# Patient Record
Sex: Male | Born: 1970
Health system: Southern US, Community
[De-identification: ages and names within clinical notes are randomized; demographics above are authoritative.]

## PROBLEM LIST (undated history)

## (undated) DIAGNOSIS — E663 Overweight: Secondary | ICD-10-CM

## (undated) DIAGNOSIS — I10 Essential (primary) hypertension: Secondary | ICD-10-CM

## (undated) DIAGNOSIS — F419 Anxiety disorder, unspecified: Secondary | ICD-10-CM

## (undated) DIAGNOSIS — IMO0001 Reserved for inherently not codable concepts without codable children: Secondary | ICD-10-CM

## (undated) DIAGNOSIS — T7840XA Allergy, unspecified, initial encounter: Secondary | ICD-10-CM

## (undated) HISTORY — DX: Overweight: E66.3

## (undated) HISTORY — DX: Anxiety disorder, unspecified: F41.9

## (undated) HISTORY — DX: Essential (primary) hypertension: I10

## (undated) HISTORY — PX: NO PAST SURGERIES: SHX2092

## (undated) HISTORY — DX: Reserved for inherently not codable concepts without codable children: IMO0001

## (undated) HISTORY — DX: Allergy, unspecified, initial encounter: T78.40XA

---

## 2003-08-25 ENCOUNTER — Encounter: Payer: Self-pay | Admitting: Chiropractic Medicine

## 2003-08-25 ENCOUNTER — Encounter: Admission: RE | Admit: 2003-08-25 | Discharge: 2003-08-25 | Payer: Self-pay | Admitting: Chiropractic Medicine

## 2005-11-09 ENCOUNTER — Ambulatory Visit: Payer: Self-pay | Admitting: Internal Medicine

## 2006-02-06 ENCOUNTER — Ambulatory Visit: Payer: Self-pay | Admitting: Internal Medicine

## 2006-10-15 ENCOUNTER — Ambulatory Visit: Payer: Self-pay | Admitting: Internal Medicine

## 2008-03-22 ENCOUNTER — Ambulatory Visit: Payer: Self-pay | Admitting: Internal Medicine

## 2008-09-02 ENCOUNTER — Ambulatory Visit: Payer: Self-pay | Admitting: Family Medicine

## 2008-11-22 ENCOUNTER — Ambulatory Visit: Payer: Self-pay | Admitting: Family Medicine

## 2008-11-23 ENCOUNTER — Encounter (INDEPENDENT_AMBULATORY_CARE_PROVIDER_SITE_OTHER): Payer: Self-pay | Admitting: *Deleted

## 2010-03-10 ENCOUNTER — Ambulatory Visit: Payer: Self-pay | Admitting: Family Medicine

## 2010-03-10 DIAGNOSIS — R319 Hematuria, unspecified: Secondary | ICD-10-CM | POA: Insufficient documentation

## 2010-03-10 LAB — CONVERTED CEMR LAB
Bilirubin Urine: NEGATIVE
Glucose, Urine, Semiquant: NEGATIVE
Ketones, urine, test strip: NEGATIVE
Nitrite: NEGATIVE
Protein, U semiquant: NEGATIVE
Specific Gravity, Urine: 1.01
Urobilinogen, UA: 1
WBC Urine, dipstick: NEGATIVE
pH: 6.5

## 2010-03-11 ENCOUNTER — Encounter: Payer: Self-pay | Admitting: Family Medicine

## 2010-03-13 ENCOUNTER — Telehealth (INDEPENDENT_AMBULATORY_CARE_PROVIDER_SITE_OTHER): Payer: Self-pay | Admitting: *Deleted

## 2010-03-13 LAB — CONVERTED CEMR LAB
Basophils Absolute: 0 10*3/uL (ref 0.0–0.1)
Basophils Relative: 0 % (ref 0–1)
CRP: 1.5 mg/dL — ABNORMAL HIGH (ref <0.6)
Creatinine, Ser: 0.98 mg/dL (ref 0.40–1.50)
HCT: 43.7 % (ref 39.0–52.0)
Lymphs Abs: 1.3 10*3/uL (ref 0.7–4.0)
MCHC: 33 g/dL (ref 30.0–36.0)
MCV: 87.6 fL (ref 78.0–100.0)
Neutrophils Relative %: 58 % (ref 43–77)
Platelets: 322 10*3/uL (ref 150–400)
Potassium: 5.1 meq/L (ref 3.5–5.3)
Sed Rate: 7 mm/hr (ref 0–16)
Sodium: 143 meq/L (ref 135–145)

## 2010-05-31 ENCOUNTER — Ambulatory Visit: Payer: Self-pay | Admitting: Internal Medicine

## 2010-12-26 NOTE — Assessment & Plan Note (Signed)
Summary: sore throat/fever//kn   Vital Signs:  Patient profile:   40 year old male Weight:      194.13 pounds Temp:     98.5 degrees F oral Pulse rate:   113 / minute Pulse rhythm:   regular BP sitting:   126 / 76  (left arm) Cuff size:   large  Vitals Entered By: Army Fossa CMA (May 31, 2010 9:11 AM) CC: C/o sore throat, fever x 36 hrs Comments - states last night temp was 101.0 this am, 99.0     History of Present Illness: 3 days' history of fever up to 103. Fever decreased with ibuprofen He also developed sore throat and some fatigue. No other family members affected with fever  ROS No runny nose No cough Moderate myalgias, generalized No nausea vomiting or diarrhea.  Allergies (verified): No Known Drug Allergies  Past History:  Past Medical History: Reviewed history from 03/22/2008 and no changes required. Unremarkable  Past Surgical History: Reviewed history from 11/22/2008 and no changes required. None  Social History: Married to [employer] with 2 children. Has a masters degree.  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   Head:  face symmetric Ears:  R ear normal and L ear normal.   Nose:  slightly congested Mouth:  no redness or discharge. Tonsils normal size Lungs:  normal respiratory effort, no intercostal retractions, no accessory muscle use, and normal breath sounds.   Heart:  normal rate, regular rhythm, no murmur, and no gallop.     Impression & Recommendations:  Problem # 1:  VIRAL INFECTION (ICD-079.99) strep A- negative Likely viral syndrome Conservative treatment, see instructions  Complete Medication List: 1)  Zyrtec Allergy 10 Mg Tabs (Cetirizine hcl) .... Take one tablet daily  Other Orders: Rapid Strep (09811)  Patient  Instructions: 1)  Get plenty of rest, drink lots of clear liquids, and use Tylenol or Ibuprofen for fever and comfort. 2)  robitussin DM as needed for cough 3)  sudafed as needed for congestion 4)  call if no better in few days and any time if high fever or worsen symptoms  5)     Laboratory Results    Other Tests  Rapid Strep: negative Comments: Army Fossa CMA  May 31, 2010 9:15 AM

## 2010-12-26 NOTE — Progress Notes (Signed)
Summary: labs  Phone Note Outgoing Call   Call placed by: Doristine Devoid,  March 13, 2010 3:27 PM Call placed to: Patient Summary of Call: CRP is mildly elevated but remainder of labs normal.  CRP can be elevated for many reasons- will need to f/u w/ pt and see how he's feeling and if fevers are persisting  Follow-up for Phone Call        spoke w/ patient aware of labs says he is doing fine and will contact office if symptoms are continuing b/c if so he will need additional w/u ........Marland KitchenDoristine Devoid  March 13, 2010 3:28 PM

## 2010-12-26 NOTE — Assessment & Plan Note (Signed)
Summary: fever at night/kdc   Vital Signs:  Patient profile:   40 year old male Weight:      198 pounds BMI:     28.92 Temp:     99.0 degrees F oral BP sitting:   120 / 60  (left arm)  Vitals Entered By: Doristine Devoid (March 10, 2010 3:09 PM) CC: fever at night up to 100 x3 days fatigue and bodyache- no other sx    History of Present Illness: 40 yo man here today for night fever.  sxs started 3 days ago.  notes fatigue and muscle aches.  temp up to 100 via ear thermometer.  sxs seem to worsen as the day goes on.  wakes up feeling rested and well but then fatigues as the day progresses.  aches most noticeable in arms and neck.  no N/VD.  no nasal congestion, sore throat.  'the feeling that you know you're getting sick but i'm not sick'.  Current Medications (verified): 1)  Astepro 137 Mcg/spray  Soln (Azelastine Hcl) .... 2 Puffs Two Times A Day 2)  Zyrtec Allergy 10 Mg Tabs (Cetirizine Hcl) .... Take One Tablet Daily  Allergies (verified): No Known Drug Allergies  Past History:  Past Medical History: Last updated: 03/22/2008 Unremarkable  Family History: Last updated: 03/22/2008 CAD - no HTN - no stroke - no DM - no colon ca - no prostate ca - no  Review of Systems      See HPI  Physical Exam  General:  Well-developed,well-nourished,in no acute distress; alert,appropriate and cooperative throughout examination Head:  Normocephalic and atraumatic without obvious abnormalities. No apparent alopecia or balding. Eyes:  No corneal or conjunctival inflammation noted. EOMI. Perrla. Ears:  External ear exam shows no significant lesions or deformities.  Otoscopic examination reveals clear canals, tympanic membranes are intact bilaterally without bulging, retraction, inflammation or discharge. Hearing is grossly normal bilaterally. Nose:  External nasal examination shows no deformity or inflammation. Mucosal membranes are mildly swollen.  Mouth:  Oral mucosa and oropharynx  without lesions or exudates.  Teeth in good repair. Neck:  No deformities, masses, or tenderness noted. Lungs:  Normal respiratory effort, chest expands symmetrically. Lungs are clear to auscultation, no crackles or wheezes. Heart:  Normal rate and regular rhythm. S1 and S2 normal without gallop, murmur, click, rub or other extra sounds. Abdomen:  Bowel sounds positive,abdomen soft and non-tender without masses, organomegaly   Impression & Recommendations:  Problem # 1:  FEVER (ICD-780.60) Assessment New pt's low grade temp likely viral but given the absence of other sxs will attempt to r/o underlying infxn, inflammation, or malignancy.  check CXR, UA, and labs.  reviewed supportive care and red flags that should prompt return.  Pt expresses understanding and is in agreement w/ this plan. Orders: Venipuncture (16109) UA Dipstick w/o Micro (manual) (60454) Specimen Handling (99000) T-Culture, Urine ([id number]) T-2 View CXR (71020TC)  Problem # 2:  FATIGUE (ICD-780.79) Assessment: New see above. Orders: UA Dipstick w/o Micro (manual) (29562) Specimen Handling (99000) T-Culture, Urine ([id number])  Complete Medication List: 1)  Astepro 137 Mcg/spray Soln (Azelastine hcl) .... 2 puffs two times a day 2)  Zyrtec Allergy 10 Mg Tabs (Cetirizine hcl) .... Take one tablet daily  Patient Instructions: 1)  Please go to [address] for your chest xray 2)  We'll notify you of your lab work 3)  Take tylenol alternating w/ ibuprofen for body aches and fever 4)  Call with any questions or concerns 5)  Hang  in there!  Laboratory Results   Urine Tests   Date/Time Reported: March 10, 2010 3:50 PM   Routine Urinalysis   Color: yellow Appearance: Clear Glucose: negative   (Normal Range: Negative) Bilirubin: negative   (Normal Range: Negative) Ketone: negative   (Normal Range: Negative) Spec. Gravity: 1.010   (Normal Range: 1.003-1.035) Blood: small   (Normal Range: Negative) pH:  6.5   (Normal Range: 5.0-8.0) Protein: negative   (Normal Range: Negative) Urobilinogen: 1.0   (Normal Range: 0-1) Nitrite: negative   (Normal Range: Negative) Leukocyte Esterace: negative   (Normal Range: Negative)    Comments: Floydene Flock  March 10, 2010 3:50 PM cx sent

## 2011-01-26 ENCOUNTER — Encounter: Payer: Self-pay | Admitting: Family Medicine

## 2011-01-26 ENCOUNTER — Ambulatory Visit (INDEPENDENT_AMBULATORY_CARE_PROVIDER_SITE_OTHER): Payer: Managed Care, Other (non HMO) | Admitting: Family Medicine

## 2011-01-26 DIAGNOSIS — M25579 Pain in unspecified ankle and joints of unspecified foot: Secondary | ICD-10-CM

## 2011-01-26 DIAGNOSIS — J309 Allergic rhinitis, unspecified: Secondary | ICD-10-CM | POA: Insufficient documentation

## 2011-02-01 NOTE — Assessment & Plan Note (Signed)
Summary: RT ANKLE PAIN (TWISTED?)/NP/LP  # [id number]   Vital Signs:  Patient profile:   40 year old male Height:      69.5 inches Weight:      191 pounds BMI:     27.90 BP sitting:   124 / 85  Vitals Entered By: Lillia Pauls CMA (January 26, 2011 11:18 AM)  Primary Care Provider:  Nolon Rod. Paz MD   History of Present Illness: 40 yo M here for R ankle pain  Patient reports about 6 weeks ago when playing soccer he rolled his ankle - probably inversion but he's not completely sure At that time had soreness in right foot medially but no swelling or bruising Was able to continue playing. Pain improved over past several weeks those some soreness with stairs Then today when walking down stairs he missed a step and inverted ankle again Severe pain initially with limping, pain located medially again but no swelling or bruising. Laid down, took advil, elevated and pain has improved quite a bit since then.   Current Problems (verified): 1)  Allergic Rhinitis  (ICD-477.9) 2)  Hematuria Unspecified  (ICD-599.70)  Current Medications (verified): 1)  Zyrtec Allergy 10 Mg Tabs (Cetirizine Hcl) .... Take One Tablet Daily  Allergies (verified): No Known Drug Allergies  Family History: Reviewed history from 03/22/2008 and no changes required. CAD - no HTN - no stroke - no DM - no colon ca - no prostate ca - no  Social History: Reviewed history from 05/31/2010 and no changes required. Married to [employer] with 2 children. Has a masters degree.  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 Well preserved long and transverse arches TTP over post tib tendon medially posterior to medial malleolus  and just distal to this in course.  No bony TTP lateral/medial malleoli, navicular, base 5th or elsewhere about foot or ankle. Pain reproduced with plantarflexion and internal rotation though strength grossly 5/5 with both and can do heel raise one footed (pain with this). Negative ant drawer and talar tilt Negative tib-fib compression. Negative thompsons. NVI distally.   Impression & Recommendations:  Problem # 1:  ANKLE PAIN, RIGHT (ICD-719.47) Assessment New 2/2 posterior tibialis tendon strain.  Much improved from this morning but still some pain.  Advil as needed, icing.  Elevation and ace wrap if starts to swell.  Full motion and strength so highly unlikely he has a tear within tendon.  Given home exercises with theraband to do regularly to strengthen this over the next 4-6 weeks.  Discussed possibility of ankle ASO to help prevent inversion injury again - will consider this with sports and in short term. See instructions for further.  Complete Medication List: 1)  Zyrtec Allergy 10 Mg Tabs (Cetirizine hcl) .... Take one tablet daily  Patient Instructions: 1)  You have strained your posterior tibialis tendon. 2)  Ice the area 15 minutes at a time 3-4 times a day. 3)  Ok to elevate and use ace wrap for compression if swelling becomes an issue. 4)  Do home exercises as directed to strengthen this muscle/tendon back up - 3 sets of 10 with theraband pushing down and pushing in with foot. 5)  Advil 2-3 tabs three times a day with food as  needed for pain and inflammation. 6)  A laceup ankle brace is an option to prevent further issues at ankle while you are strengthening this. 7)  Follow up with me as needed.   Orders Added: 1)  New Patient Level III [16109]

## 2011-08-20 ENCOUNTER — Encounter: Payer: Self-pay | Admitting: Internal Medicine

## 2011-08-20 ENCOUNTER — Ambulatory Visit (INDEPENDENT_AMBULATORY_CARE_PROVIDER_SITE_OTHER): Payer: Managed Care, Other (non HMO) | Admitting: Internal Medicine

## 2011-08-20 VITALS — BP 128/78 | HR 71 | Temp 98.4°F | Resp 14 | Wt 201.0 lb

## 2011-08-20 DIAGNOSIS — J029 Acute pharyngitis, unspecified: Secondary | ICD-10-CM

## 2011-08-20 LAB — POCT RAPID STREP A (OFFICE): Rapid Strep A Screen: NEGATIVE

## 2011-08-20 NOTE — Assessment & Plan Note (Signed)
Strep a neg, likely viral, see instructions

## 2011-08-20 NOTE — Patient Instructions (Signed)
Rest, fluids , tylenol Call if no completely well  in few days

## 2011-08-20 NOTE — Progress Notes (Signed)
  Subjective:    Patient ID: Jorge Schwartz, male    DOB: 1971/11/01, 40 y.o.   MRN: [id number]  HPI As above Sx are subsiding, used some chloraseptic OTC  Family History: CAD - no HTN - no stroke - no DM - no colon ca - no prostate ca - no  Social History:  Married to [employer] with 2 children. Has a masters degree.  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 No distress.  HENT:  Head: Normocephalic and atraumatic.  Right Ear: External ear normal.  Left Ear: External ear normal.  Mouth/Throat: No oropharyngeal exudate.       Uvula midline, tonsils normal size   Cardiovascular: Normal rate, regular rhythm and normal heart sounds.   No murmur heard. Pulmonary/Chest: Effort normal and breath sounds normal. No respiratory distress. He has no wheezes. He has no rales.  Lymphadenopathy:    He has no cervical adenopathy.  Skin: He is not diaphoretic.          Assessment & Plan:

## 2012-03-31 ENCOUNTER — Ambulatory Visit (INDEPENDENT_AMBULATORY_CARE_PROVIDER_SITE_OTHER): Payer: Managed Care, Other (non HMO) | Admitting: Family Medicine

## 2012-03-31 ENCOUNTER — Encounter: Payer: Self-pay | Admitting: Family Medicine

## 2012-03-31 VITALS — BP 146/85 | HR 58 | Temp 97.8°F | Ht 70.0 in | Wt 182.0 lb

## 2012-03-31 DIAGNOSIS — M549 Dorsalgia, unspecified: Secondary | ICD-10-CM

## 2012-03-31 DIAGNOSIS — S29012A Strain of muscle and tendon of back wall of thorax, initial encounter: Secondary | ICD-10-CM

## 2012-03-31 DIAGNOSIS — M546 Pain in thoracic spine: Secondary | ICD-10-CM

## 2012-03-31 DIAGNOSIS — S239XXA Sprain of unspecified parts of thorax, initial encounter: Secondary | ICD-10-CM

## 2012-03-31 MED ORDER — TRAMADOL HCL 50 MG PO TABS: 50.0000 mg | ORAL_TABLET | Freq: Three times a day (TID) | ORAL | Status: DC | PRN

## 2012-03-31 NOTE — Patient Instructions (Signed)
You have strained (overused) scapular stabilizer muscles in your upper back (trapezius, rhomboids) leading to severe spasms. Strengthening and stretches are important for long term relief of this. Don't do heavy weight exercises that will use this area (push ups, bench press, flys). Avoid painful activities as much as possible. Start rows, lawnmower, robbery exercises - start with no weight, work way up to 15 pounds over next 2 weeks). - 3 sets of 10 once a day of each. Aleve 2 tabs twice a day with food for pain and inflammation x 1 week then as needed. Tramadol as needed for severe pain but no driving on this. Consider physical therapy, muscle relaxants if not improving.

## 2012-03-31 NOTE — Assessment & Plan Note (Signed)
2/2 trapezius and/or rhomboid spasms.  Start HEP - declined formal PT at this time.  Start aleve 2 tabs twice a day with food.  Tramadol as needed for severe pain.  Declined muscle relaxant.  See instructions for further.

## 2012-03-31 NOTE — Progress Notes (Signed)
  Subjective:    Patient ID: Jorge Schwartz, male    DOB: 1970-12-20, 41 y.o.   MRN: [id number]  PCP: Dr. Drue Novel  HPI 41 yo M here for right upper back pain.  Patient denies known injury. He works out regularly - did so 1 week ago with weight lifting with mild soreness between spine and shoulder blade. Also has done some painting recently at home. Is right handed. Pain has intensified over past week, peaking Saturday and Sunday. Worse with certain movements - like scratching back with overhead motion. Tried advil, icy hot. No numbness, tingling, radiation. No prior upper back problems.  Past Medical History  Diagnosis Date  . No active medical problems     Current Outpatient Prescriptions on File Prior to Visit  Medication Sig Dispense Refill  . cetirizine (ZYRTEC) 10 MG tablet Take 10 mg by mouth daily.          Past Surgical History  Procedure Date  . No past surgeries     No Known Allergies  History   Social History  . Marital Status: Married    Spouse Name: N/A    Number of Children: N/A  . Years of Education: N/A   Occupational History  . Not on file.   Social History Main Topics  . Smoking status: Never Smoker   . Smokeless tobacco: Not on file  . Alcohol Use: Not on file  . Drug Use: Not on file  . Sexually Active: Not on file   Other Topics Concern  . Not on file   Social History Narrative  . No narrative on file    Family History  Problem Relation Age of Onset  . Sudden death Neg Hx     BP 146/85  Pulse 58  Temp(Src) 97.8 F (36.6 C) (Oral)  Ht 5\' 10"  (1.778 m)  Wt 182 lb (82.555 kg)  BMI 26.11 kg/m2  Review of Systems See HPI above.    Objective:   Physical Exam Gen: NAD  Neck/Upper back: No gross deformity, swelling, bruising.  Spasms R > L paraspinal region thoracic spine medial to scapula. TTP mildly on right side in this location.  No midline/bony TTP. FROM neck without pain currently (has had pain with neck motions in the  area noted above). BUE strength 5/5.   Sensation intact to light touch.   NV intact distal BUEs.     Assessment & Plan:  1. Upper back pain - 2/2 trapezius and/or rhomboid spasms.  Start HEP - declined formal PT at this time.  Start aleve 2 tabs twice a day with food.  Tramadol as needed for severe pain.  Declined muscle relaxant.  See instructions for further.

## 2012-04-01 MED ORDER — TRAMADOL HCL 50 MG PO TABS: 50.0000 mg | ORAL_TABLET | Freq: Three times a day (TID) | ORAL | Status: DC | PRN

## 2012-04-01 NOTE — Progress Notes (Signed)
Addended by: Lenda Kelp on: 04/01/2012 09:30 AM   Modules accepted: Orders

## 2012-10-10 ENCOUNTER — Ambulatory Visit (INDEPENDENT_AMBULATORY_CARE_PROVIDER_SITE_OTHER): Payer: Managed Care, Other (non HMO) | Admitting: Family Medicine

## 2012-10-10 VITALS — BP 110/82 | HR 77 | Temp 98.5°F | Wt 200.0 lb

## 2012-10-10 DIAGNOSIS — R0982 Postnasal drip: Secondary | ICD-10-CM

## 2012-10-10 DIAGNOSIS — Z23 Encounter for immunization: Secondary | ICD-10-CM

## 2012-10-10 NOTE — Progress Notes (Signed)
  Subjective:    Patient ID: Jorge Schwartz, male    DOB: 1970/12/13, 41 y.o.   MRN: [id number]  HPI Cough- 3 weeks ago had 'terrible terrible fever' x3 days.  Saw MD, took abx, but developed cough 'it just won't go away'.  Was given Tessalon w/out relief.  Took Mucinex DM.  Cough is constant, not productive.  No nasal congestion, sore throat, ear pain, PND, fevers.  Cough kept him awake last night.  No known sick contacts.   Review of Systems For ROS see HPI     Objective:   Physical Exam  Vitals reviewed. Constitutional: He appears well-developed and well-nourished. No distress.  HENT:  Head: Normocephalic and atraumatic.       No TTP over sinuses + turbinate edema + PND TMs normal bilaterally  Eyes: Conjunctivae normal and EOM are normal. Pupils are equal, round, and reactive to light.  Neck: Normal range of motion. Neck supple.  Cardiovascular: Normal rate, regular rhythm and normal heart sounds.   Pulmonary/Chest: Effort normal and breath sounds normal. No respiratory distress. He has no wheezes.       Dry cough  Lymphadenopathy:    He has no cervical adenopathy.  Skin: Skin is warm and dry.          Assessment & Plan:

## 2012-10-10 NOTE — Assessment & Plan Note (Signed)
New.  Likely the cause of ongoing cough.  Increase Zyrtec use to daily.  Add nasal steroid- sample of nasonex given.  Reviewed supportive care and red flags that should prompt return.  Pt expressed understanding and is in agreement w/ plan.

## 2012-10-10 NOTE — Patient Instructions (Addendum)
The cough is due to post nasal drip Start the Zyrtec daily until you feel better Add the nasal spray- 2 sprays each nostril to decrease post nasal drip Drink plenty of fluids Continue the Mucinex to thin your congestion REST! Hang in there!!

## 2013-01-22 ENCOUNTER — Ambulatory Visit (INDEPENDENT_AMBULATORY_CARE_PROVIDER_SITE_OTHER): Payer: BC Managed Care – PPO | Admitting: Internal Medicine

## 2013-01-22 ENCOUNTER — Telehealth: Payer: Self-pay | Admitting: *Deleted

## 2013-01-22 ENCOUNTER — Encounter: Payer: Self-pay | Admitting: Internal Medicine

## 2013-01-22 VITALS — BP 112/82 | HR 74 | Temp 98.0°F | Wt 206.0 lb

## 2013-01-22 DIAGNOSIS — R0602 Shortness of breath: Secondary | ICD-10-CM

## 2013-01-22 LAB — BASIC METABOLIC PANEL
Chloride: 107 mEq/L (ref 96–112)
Potassium: 4.5 mEq/L (ref 3.5–5.1)
Sodium: 140 mEq/L (ref 135–145)

## 2013-01-22 LAB — D-DIMER, QUANTITATIVE: D-Dimer, Quant: 0.27 ug/mL-FEU (ref 0.00–0.48)

## 2013-01-22 NOTE — Progress Notes (Signed)
  Subjective:    Patient ID: Jorge Schwartz, male    DOB: [birth year], 42 y.o.   MRN: [id number]  HPI Acute visit, presents with the following history: Last week, he took an airplane going to [facility], upon landing he felt sweaty, had chills, nasal congestion, had mild shortness of breath and felt claustrophobic. Symptoms lasted about 15 minutes. That night  he felt better, went to exercise. Later on he developed fever, chills and felt unwell. Had similar symptoms on his flight back but much milder.  Past Medical History  Diagnosis Date  . No active medical problems    Past Surgical History  Procedure Laterality Date  . No past surgeries     History   Social History  . Marital Status: Married    Spouse Name: N/A    Number of Children: 2  . Years of Education: N/A   Occupational History  . Not on file.   Social History Main Topics  . Smoking status: Never Smoker   . Smokeless tobacco: Never Used  . Alcohol Use: Yes     Comment: socially   . Drug Use: Not on file  . Sexually Active: Not on file   Other Topics Concern  . Not on file   Social History Narrative    Married to [employer] with 2 children. Has a masters degree.  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 He had mild sore throat but not cough, no postnasal dripping. Had some diarrhea but denies abdominal pain, nausea, vomiting. He is a frequent flyer, hardly ever gets nervous during flying.    Objective:   Physical Exam  General -- alert, well-developed, healthy appearing, VSS    Lungs -- normal respiratory effort, no intercostal retractions, no accessory muscle use, and normal breath sounds.   Heart-- normal rate, regular rhythm, no murmur, and no gallop.   Abdomen--soft, non-tender, no distention, no masses, no HSM, no guarding, and no rigidity.   Extremities-- no pretibial edema  bilaterally; calves symmetric  Neurologic-- alert & oriented X3 and strength normal in all extremities. Psych-- Cognition and judgment appear intact. Alert and cooperative with normal attention span and concentration.  not anxious appearing and not depressed appearing.       Assessment & Plan:

## 2013-01-22 NOTE — Assessment & Plan Note (Signed)
Shortness or breath Patient is a frequent  airplane flyer, last week developed sweats, chills and some shortness or breath during a airplane flight. Shortly after he had  fever. Most likely he had a viral syndrome however I think is important to rule out a clot. Symptoms could also be related to anxiety although he is usually very relaxed during his trips Plan:  D-dimer, BMP. If negative, then he most likely had a viral syndrome and will only need reassurance, rest and Tylenol. Will call if symptoms continue happening.

## 2013-01-22 NOTE — Patient Instructions (Signed)
Rest, fluids, Tylenol. Call if the  episodes continue happening.

## 2013-01-22 NOTE — Telephone Encounter (Signed)
D dimer normal- should proceed w/ plan as discussed at OV

## 2013-01-22 NOTE — Telephone Encounter (Signed)
Discuss with patient  

## 2013-01-22 NOTE — Telephone Encounter (Signed)
Stat labs report  D-dimer 0.27.Please advise

## 2013-02-13 ENCOUNTER — Telehealth: Payer: Self-pay | Admitting: Internal Medicine

## 2013-02-13 MED ORDER — ALPRAZOLAM ER 0.5 MG PO TB24: ORAL_TABLET | ORAL | Status: DC

## 2013-02-13 NOTE — Telephone Encounter (Signed)
Discussed with pt, sent rx to pharmacy.  

## 2013-02-13 NOTE — Telephone Encounter (Signed)
Spoke to pt, he states Dr. Drue Novel recently saw him for a panic attack on a plane. Pt states that he has been stressing over this & has seen a therapist. Pt states he is flying out on Sunday & would like to know if there is something he can take to calm his nerves. Please advise.

## 2013-02-13 NOTE — Telephone Encounter (Signed)
Call xanax 0.5 mg 1/2 to 1 tab at time of airplane ryde call #[phone], no Rf. Start w/ 1/2 tab as he may get very sleepy

## 2013-02-13 NOTE — Telephone Encounter (Signed)
Patient states he needs to speak with Dr. Drue Novel regarding a medication.

## 2014-06-29 ENCOUNTER — Ambulatory Visit (INDEPENDENT_AMBULATORY_CARE_PROVIDER_SITE_OTHER): Payer: BC Managed Care – PPO | Admitting: Family

## 2014-06-29 ENCOUNTER — Encounter: Payer: Self-pay | Admitting: Family

## 2014-06-29 VITALS — BP 126/76 | HR 67 | Temp 98.0°F | Resp 16 | Ht 69.5 in | Wt 212.0 lb

## 2014-06-29 DIAGNOSIS — A09 Infectious gastroenteritis and colitis, unspecified: Secondary | ICD-10-CM

## 2014-06-29 DIAGNOSIS — R197 Diarrhea, unspecified: Secondary | ICD-10-CM

## 2014-06-29 MED ORDER — CIPROFLOXACIN HCL 500 MG PO TABS: 500.0000 mg | ORAL_TABLET | Freq: Two times a day (BID) | ORAL | Status: DC

## 2014-06-29 NOTE — Progress Notes (Signed)
Pre visit review using our clinic review tool, if applicable. No additional management support is needed unless otherwise documented below in the visit note. 

## 2014-06-29 NOTE — Patient Instructions (Signed)
Please complete stool kits and return at your earliest convenience. Start cipro. You may use imodium as needed for diarrhea.  Make sure to stay well hydrated.  Call if symptoms worsen, if you develop fever/abdominal pain, vomiting, or if not improved in 1 week.

## 2014-06-29 NOTE — Progress Notes (Signed)
   Subjective:    Patient ID: Jorge Schwartz, male    DOB: 1971-09-26, 43 y.o.   MRN: [id number]  HPI  Mr. Jorge Schwartz is a 43 yr old male who presents today with chief complaint of diarrhea. Diarrhea started 5 days ago. [place].  Wife had a little diarrhea today only. Denies  Associated ausea, vomitting, abdominal pain or fever.  Tolerating PO's. Had HA Saturday which resolved.       Review of Systems See HPI  Past Medical History  Diagnosis Date  . No active medical problems     History   Social History  . Marital Status: Married    Spouse Name: N/A    Number of Children: 2  . Years of Education: N/A   Occupational History  . Not on file.   Social History Main Topics  . Smoking status: Never Smoker   . Smokeless tobacco: Never Used  . Alcohol Use: Yes     Comment: socially   . Drug Use: Not on file  . Sexual Activity: Not on file   Other Topics Concern  . Not on file   Social History Narrative    Married to Jorge Schwartz with 2 children. Has a masters degree.  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     2 boys (twins)   Born [place]    Past Surgical History  Procedure Laterality Date  . No past surgeries      Family History  Problem Relation Age of Onset  . Sudden death Neg Hx     No Known Allergies  Current Outpatient Prescriptions on File Prior to Visit  Medication Sig Dispense Refill  . cetirizine (ZYRTEC) 10 MG tablet Take 10 mg by mouth daily.        Marland Kitchen. ALPRAZolam (XANAX XR) 0.5 MG 24 hr tablet Take 1/2-1 tablet when flying on an airplane.  15 tablet  0   No current facility-administered medications on file prior to visit.    BP 126/76  Pulse 67  Temp(Src) 98 F (36.7 C) (Oral)  Resp 16  Ht 5' 9.5" (1.765 m)  Wt 212 lb 0.6 oz (96.181 kg)  BMI 30.87 kg/m2  SpO2 99%       Objective:   Physical Exam  Constitutional: He is oriented to person, place, and time. He appears well-developed and well-nourished. No  distress.  HENT:  Head: Normocephalic and atraumatic.  Cardiovascular: Normal rate and regular rhythm.   No murmur heard. Pulmonary/Chest: Effort normal and breath sounds normal. No respiratory distress. He has no wheezes. He has no rales. He exhibits no tenderness.  Abdominal: Soft. He exhibits no distension and no mass. Bowel sounds are increased. There is no tenderness. There is no rebound and no guarding.  Neurological: He is alert and oriented to person, place, and time.  Psychiatric: He has a normal mood and affect. His behavior is normal. Judgment and thought content normal.          Assessment & Plan:

## 2014-06-29 NOTE — Assessment & Plan Note (Signed)
Rx with empiric cipro.  Obtain stool studies, follow up if symptoms worsen or do not improve.

## 2014-06-30 ENCOUNTER — Other Ambulatory Visit: Payer: Self-pay | Admitting: Family

## 2014-07-01 LAB — CLOSTRIDIUM DIFFICILE BY PCR: Toxigenic C. Difficile by PCR: NOT DETECTED

## 2014-07-01 LAB — OVA AND PARASITE EXAMINATION: OP: NONE SEEN

## 2014-07-04 LAB — STOOL CULTURE

## 2016-02-22 ENCOUNTER — Ambulatory Visit (INDEPENDENT_AMBULATORY_CARE_PROVIDER_SITE_OTHER): Payer: BLUE CROSS/BLUE SHIELD | Admitting: Family Medicine

## 2016-02-22 ENCOUNTER — Encounter: Payer: Self-pay | Admitting: Family Medicine

## 2016-02-22 ENCOUNTER — Telehealth: Payer: Self-pay | Admitting: Family Medicine

## 2016-02-22 VITALS — BP 114/82 | HR 112 | Temp 98.8°F | Ht 69.0 in | Wt 211.8 lb

## 2016-02-22 DIAGNOSIS — R52 Pain, unspecified: Secondary | ICD-10-CM

## 2016-02-22 DIAGNOSIS — J09X2 Influenza due to identified novel influenza A virus with other respiratory manifestations: Secondary | ICD-10-CM

## 2016-02-22 DIAGNOSIS — R6889 Other general symptoms and signs: Secondary | ICD-10-CM | POA: Diagnosis not present

## 2016-02-22 LAB — POCT INFLUENZA A/B: Influenza A, POC: POSITIVE — AB

## 2016-02-22 MED ORDER — OSELTAMIVIR PHOSPHATE 75 MG PO CAPS: 75.0000 mg | ORAL_CAPSULE | Freq: Two times a day (BID) | ORAL | Status: DC

## 2016-02-22 NOTE — Telephone Encounter (Signed)
error 

## 2016-02-22 NOTE — Patient Instructions (Signed)
You have the flu- use the tamiflu twice a day for 5 days and drink plenty of fluids Use OTC medications as needed for symptoms Let me know if you are not feeling better soon!

## 2016-02-22 NOTE — Progress Notes (Signed)
[facility] [address], [address] [place], [place] [phone] [phone]  Date:  02/22/2016   Name:  Jorge MendsRodrigo Etherington   DOB:  04/09/1971   MRN:  [id number]  PCP:  Willow OraJose Paz, MD    Chief Complaint: URI   History of Present Illness:  Jorge Schwartz is a 45 y.o. very pleasant male patient who presents with the following:  Today is Wednesday- he noted onset of sx Monday.  Thought that he had allergies. Yesterday he felt much worse, had to leave owrk early, felt awful, took a long nap which is very unusual for him. Today he continued to feel bad so he came in to be seen  He has a bad ST, body aches, fatigue.  Not much of a cough however His children are 298 yo twins- neither of them are sick He had a little diarrhea yesterday, no vomiting He had a temp of 100.5 earlier today  He used some sudafed at home and some afrin  He did have his flu shot Married, never a smoker   Patient Active Problem List   Diagnosis Date Noted  . Traveler's diarrhea 06/29/2014  . SOB (shortness of breath) 01/22/2013  . ALLERGIC RHINITIS 01/26/2011    Past Medical History  Diagnosis Date  . No active medical problems     Past Surgical History  Procedure Laterality Date  . No past surgeries      Social History  Substance Use Topics  . Smoking status: Never Smoker   . Smokeless tobacco: Never Used  . Alcohol Use: Yes     Comment: socially     Family History  Problem Relation Age of Onset  . Sudden death Neg Hx     No Known Allergies  Medication list has been reviewed and updated.  Current Outpatient Prescriptions on File Prior to Visit  Medication Sig Dispense Refill  . cetirizine (ZYRTEC) 10 MG tablet Take 10 mg by mouth daily as needed.     . Sertraline HCl (ZOLOFT PO) Take 1 tablet by mouth daily.     Marland Kitchen. ALPRAZolam (XANAX XR) 0.5 MG 24 hr tablet Take 1/2-1 tablet when flying on an airplane. (Patient not taking: Reported on  02/22/2016) 15 tablet 0   No current facility-administered medications on file prior to visit.    Review of Systems:  As per HPI- otherwise negative.   Physical Examination: Filed Vitals:   02/22/16 1317  BP: 114/82  Pulse: 124  Temp: 98.8 F (37.1 C)   Filed Vitals:   02/22/16 1317  Height: 5\' 9"  (1.753 m)  Weight: 211 lb 12.8 oz (96.072 kg)   Body mass index is 31.26 kg/(m^2). Ideal Body Weight: Weight in (lb) to have BMI = 25: 168.9  GEN: WDWN, NAD, Non-toxic, A & O x 3, looks overall well HEENT: Atraumatic, Normocephalic. Neck supple. No masses, No LAD.  Bilateral TM wnl, oropharynx normal.  PEERL,EOMI.   Ears and Nose: No external deformity. CV: RRR- fast, No M/G/R. No JVD. No thrill. No extra heart sounds. PULM: CTA B, no wheezes, crackles, rhonchi. No retractions. No resp. distress. No accessory muscle use. ABD: S, NT, ND, +BS. No rebound. No HSM. EXTR: No c/c/e NEURO Normal gait.  PSYCH: Normally interactive. Conversant. Not depressed or anxious appearing.  Calm demeanor.   Results for orders placed or performed in visit on 02/22/16  POCT Influenza A/B  Result Value Ref Range   Influenza A,  POC Positive (A) Negative   Influenza B, POC  Negative    Assessment and Plan: Influenza due to identified novel influenza A virus with other respiratory manifestations - Plan: oseltamivir (TAMIFLU) 75 MG capsule  Body aches - Plan: oseltamivir (TAMIFLU) 75 MG capsule  Strep negative but he does test positive for flu a Start on tamiflu., supportive care, fluids, rest He will let me know if not feeling better in the next few days- Sooner if worse.     Meds ordered this encounter  Medications  . oseltamivir (TAMIFLU) 75 MG capsule    Sig: Take 1 capsule (75 mg total) by mouth 2 (two) times daily.    Dispense:  10 capsule    Refill:  0     Signed Abbe Amsterdam, MD

## 2016-10-11 ENCOUNTER — Ambulatory Visit (INDEPENDENT_AMBULATORY_CARE_PROVIDER_SITE_OTHER): Payer: BLUE CROSS/BLUE SHIELD | Admitting: Medical

## 2016-10-11 ENCOUNTER — Encounter: Payer: Self-pay | Admitting: Medical

## 2016-10-11 VITALS — BP 114/74 | HR 88 | Temp 98.5°F | Ht 69.0 in | Wt 215.4 lb

## 2016-10-11 DIAGNOSIS — R5383 Other fatigue: Secondary | ICD-10-CM

## 2016-10-11 DIAGNOSIS — R197 Diarrhea, unspecified: Secondary | ICD-10-CM

## 2016-10-11 DIAGNOSIS — M791 Myalgia, unspecified site: Secondary | ICD-10-CM

## 2016-10-11 DIAGNOSIS — R509 Fever, unspecified: Secondary | ICD-10-CM

## 2016-10-11 LAB — POCT RAPID STREP A (OFFICE): Rapid Strep A Screen: POSITIVE — AB

## 2016-10-11 LAB — POC INFLUENZA A&B (BINAX/QUICKVUE)
INFLUENZA B, POC: NEGATIVE
Influenza A, POC: NEGATIVE

## 2016-10-11 MED ORDER — AMOXICILLIN 875 MG PO TABS: 875.0000 mg | ORAL_TABLET | Freq: Two times a day (BID) | ORAL | 0 refills | Status: DC

## 2016-10-11 NOTE — Progress Notes (Signed)
Pre visit review using our clinic review tool, if applicable. No additional management support is needed unless otherwise documented below in the visit note./hsm  

## 2016-10-11 NOTE — Progress Notes (Signed)
Subjective:    Patient ID: Jorge Schwartz, male    DOB: 1971-04-11, 45 y.o.   MRN: [id number]  HPI  Pt has rapid acute onset of illness 2  days ago. Reports fever, body aches, and fatigue. Pt has no cough,no wheezing, no sob and chest congestion. Pt states he had flu before and it felt like this.  Pt last flu season got the flu.   Mild tender submandibular nodes.  Pt got flu vaccine 3 weeks. Ago.  Pt has some some loose stools yesterday 5-6 but today only 2. No vomitig and no nausea.  Review of Systems  Constitutional: Positive for chills, fatigue and fever.  HENT: Negative for congestion, ear discharge, postnasal drip, sinus pain, sinus pressure and sore throat.   Gastrointestinal: Negative for abdominal pain, nausea and vomiting.       Loose stools.  Musculoskeletal: Positive for back pain and myalgias. Negative for neck stiffness.  Skin: Negative for rash.  Psychiatric/Behavioral: Negative for behavioral problems, confusion, dysphoric mood, sleep disturbance and suicidal ideas. The patient is not nervous/anxious and is not hyperactive.    Past Medical History:  Diagnosis Date  . No active medical problems      Social History   Social History  . Marital status: Married    Spouse name: N/A  . Number of children: 2  . Years of education: N/A   Occupational History  . Not on file.   Social History Main Topics  . Smoking status: Never Smoker  . Smokeless tobacco: Never Used  . Alcohol use Yes     Comment: socially   . Drug use: Unknown  . Sexual activity: Not on file   Other Topics Concern  . Not on file   Social History Narrative    Married to [name] with 2 children. Has a masters degree.  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 NO PAST SURGERIES      Family History  Problem Relation Age of Onset  . Sudden death Neg Hx     No Known  Allergies  Current Outpatient Prescriptions on File Prior to Visit  Medication Sig Dispense Refill  . cetirizine (ZYRTEC) 10 MG tablet Take 10 mg by mouth daily as needed.     . Sertraline HCl (ZOLOFT PO) Take 1 tablet by mouth daily.      No current facility-administered medications on file prior to visit.     BP 114/74 (BP Location: Left Arm, Patient Position: Sitting)   Pulse 88   Temp 98.5 F (36.9 C) (Oral)   Ht 5\' 9"  (1.753 m)   Wt 215 lb 6.4 oz (97.7 kg)   SpO2 98%   BMI 31.81 kg/m       Objective:   Physical Exam  General  Mental Status - Alert. General Appearance - Well groomed. Not in acute distress.  Skin Rashes- No Rashes.  HEENT Head- Normal. Ear Auditory Canal - Left- Normal. Right - Normal.Tympanic Membrane- Left- Normal. Right- Normal. Eye Sclera/Conjunctiva- Left- Normal. Right- Normal. Nose & Sinuses Nasal Mucosa- Left-  Boggy and Congested. Right-  Boggy and  Congested.Bilateral maxillary and frontal sinus pressure. Mouth & Throat Lips: Upper Lip- Normal: no dryness, cracking, pallor, cyanosis, or vesicular eruption. Lower Lip-Normal: no dryness, cracking, pallor, cyanosis or vesicular eruption. Buccal Mucosa- Bilateral- No Aphthous ulcers. Oropharynx- No Discharge or  Erythema. Tonsils: Characteristics- Bilateral- faint Erythema or Congestion. Size/Enlargement- Bilateral- No enlargement. Discharge- bilateral-None.  Neck Neck- Supple. No Masses. Faint submandibular node tenderness.   Chest and Lung Exam Auscultation: Breath Sounds:-Clear even and unlabored.  Cardiovascular Auscultation:Rythm- Regular, rate and rhythm. Murmurs & Other Heart Sounds:Ausculatation of the heart reveal- No Murmurs.  Lymphatic Head & Neck General Head & Neck Lymphatics: Bilateral: Description- No Localized lymphadenopathy.  Abd- soft, nt, nd, +bs.      Assessment & Plan:  Your strep test came back positive and flu test came back negative. I think you have  early strep. Your tender submandibular nodes go along with this diagnosis and other symptoms can occur with strep as well. Rx amoxicillin. Rest and hydrate.  Follow up in 7 days or as needed

## 2016-10-11 NOTE — Patient Instructions (Addendum)
Your strep test came back positive and flu test came back negative. I think you have early strep. Your tender submandibular nodes go along with this diagnosis and other symptoms can occur with strep as well. Rx amoxicillin. Rest and hydrate.  Follow up in 7 days or as needed  Regarding you loose stools eat bland foods and hydrate. If you loose stools worsen then turn in stool panel kit before friday. Otherwise if improved don't need to turn in stool kit.

## 2016-10-17 ENCOUNTER — Encounter: Payer: Self-pay | Admitting: Internal Medicine

## 2016-10-17 ENCOUNTER — Ambulatory Visit (INDEPENDENT_AMBULATORY_CARE_PROVIDER_SITE_OTHER): Payer: BLUE CROSS/BLUE SHIELD | Admitting: Internal Medicine

## 2016-10-17 VITALS — BP 118/76 | HR 76 | Temp 98.1°F | Resp 12 | Ht 69.0 in | Wt 215.0 lb

## 2016-10-17 DIAGNOSIS — J020 Streptococcal pharyngitis: Secondary | ICD-10-CM | POA: Diagnosis not present

## 2016-10-17 DIAGNOSIS — F411 Generalized anxiety disorder: Secondary | ICD-10-CM

## 2016-10-17 MED ORDER — AZITHROMYCIN 250 MG PO TABS: ORAL_TABLET | ORAL | 0 refills | Status: DC

## 2016-10-17 MED ORDER — HYDROCODONE-HOMATROPINE 5-1.5 MG/5ML PO SYRP: 5.0000 mL | ORAL_SOLUTION | Freq: Every evening | ORAL | 0 refills | Status: DC | PRN

## 2016-10-17 NOTE — Progress Notes (Signed)
Subjective:    Patient ID: Jorge Schwartz, male    DOB: 1971-06-05, 45 y.o.   MRN: [id number]  DOS:  10/17/2016 Type of visit - description : acute Interval history: Symptoms started 8 days ago with fever, fatigue, mild diarrhea, mild cough. 6 days ago he came to the office with those symptoms, strep test was positive, flu test was negative. Was prescribed amoxicillin, also took Advil. Diarrhea and fever or gone. He continue with sore throat, the mild cough has increased and is more persistent. He continued to be fatigue. Still has some sore throat. No other family members affected.   Review of Systems Denies nausea or vomiting No GERD symptoms No rash + Yellow sputum production, some wheezing? No history of asthma.  Past Medical History:  Diagnosis Date  . No active medical problems     Past Surgical History:  Procedure Laterality Date  . NO PAST SURGERIES      Social History   Social History  . Marital status: Married    Spouse name: N/A  . Number of children: 2  . Years of education: N/A   Occupational History  . Not on file.   Social History Main Topics  . Smoking status: Never Smoker  . Smokeless tobacco: Never Used  . Alcohol use Yes     Comment: socially   . Drug use: Unknown  . Sexual activity: Not on file   Other Topics Concern  . Not on file   Social History Narrative    Married to [name] with 2 children. Has a masters degree.  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 Always use your most recent med list.          azithromycin 250 MG tablet Commonly known as:  ZITHROMAX Z-PAK 2 tabs a day the first day, then 1 tab a day x 4 days   cetirizine 10 MG tablet Commonly known as:  ZYRTEC Take 10 mg by mouth daily as needed.   HYDROcodone-homatropine 5-1.5 MG/5ML syrup Commonly known as:  HYCODAN Take 5 mLs by mouth at bedtime as  needed for cough.   ZOLOFT PO Take 1 tablet by mouth daily.          Objective:   Physical Exam BP 118/76 (BP Location: Left Arm, Patient Position: Sitting, Cuff Size: Normal)   Pulse 76   Temp 98.1 F (36.7 C) (Oral)   Resp 12   Ht 5\' 9"  (1.753 m)   Wt 215 lb (97.5 kg)   SpO2 96%   BMI 31.75 kg/m  General:   Well developed, well nourished . NAD.  HEENT:  Normocephalic . Face symmetric, atraumatic. TMs normal. Nose is slightly congested, sinuses no TTP. Throat: Symmetric, no red, no discharge, tonsils not enlarged, uvula midline Lungs:  CTA B Normal respiratory effort, no intercostal retractions, no accessory muscle use. Heart: RRR,  no murmur.  No pretibial edema bilaterally  Skin: Not pale. Not jaundice Neurologic:  alert & oriented X3.  Speech normal, gait appropriate for age and unassisted Psych--  Cognition and judgment appear intact.  Cooperative with normal attention span and concentration.  Behavior appropriate. No anxious or depressed appearing.      Assessment & Plan:   Assessment Anxiety  PLAN:  Strep throat: Recently diagnosed with a strep  throat, on amoxicillin, throat exam is benign, he has increasing cough.   will switch to a Z-Pak. Also recommend Mucinex. If cough severe hydrocodone ( risk of addiction d/w pt)  Call if no better

## 2016-10-17 NOTE — Progress Notes (Signed)
Pre visit review using our clinic review tool, if applicable. No additional management support is needed unless otherwise documented below in the visit note. 

## 2016-10-17 NOTE — Patient Instructions (Signed)
Stop amoxicillin, start Zithromax  Mucinex DM twice a day until better  Rest, drink plenty of fluids  If the cough is persistent or severe: Take hydrocodone at night only, will cause drowsiness.  Schedule a physical at your convenience  Call if not improving in the next week.

## 2016-10-19 DIAGNOSIS — F411 Generalized anxiety disorder: Secondary | ICD-10-CM | POA: Insufficient documentation

## 2016-10-19 DIAGNOSIS — Z09 Encounter for follow-up examination after completed treatment for conditions other than malignant neoplasm: Secondary | ICD-10-CM | POA: Insufficient documentation

## 2016-10-19 NOTE — Assessment & Plan Note (Signed)
Strep throat: Recently diagnosed with a strep throat, on amoxicillin, throat exam is benign, he has increasing cough.   will switch to a Z-Pak. Also recommend Mucinex. If cough severe hydrocodone ( risk of addiction d/w pt)  Call if no better

## 2016-10-24 ENCOUNTER — Other Ambulatory Visit: Payer: Self-pay | Admitting: Internal Medicine

## 2016-10-24 ENCOUNTER — Encounter: Payer: Self-pay | Admitting: Internal Medicine

## 2016-10-24 MED ORDER — GUAIFENESIN-CODEINE 100-10 MG/5ML PO SOLN: 5.0000 mL | Freq: Every evening | ORAL | 0 refills | Status: DC | PRN

## 2016-10-24 MED ORDER — BENZONATATE 200 MG PO CAPS: 200.0000 mg | ORAL_CAPSULE | Freq: Three times a day (TID) | ORAL | 0 refills | Status: DC | PRN

## 2016-10-25 NOTE — Telephone Encounter (Signed)
Guaifensin-codeine Rx placed at front desk for pick up.

## 2016-12-03 DIAGNOSIS — F411 Generalized anxiety disorder: Secondary | ICD-10-CM | POA: Diagnosis not present

## 2017-03-11 DIAGNOSIS — F411 Generalized anxiety disorder: Secondary | ICD-10-CM | POA: Diagnosis not present

## 2018-01-22 ENCOUNTER — Telehealth: Payer: Self-pay

## 2018-01-22 MED ORDER — OSELTAMIVIR PHOSPHATE 75 MG PO CAPS: 75.0000 mg | ORAL_CAPSULE | Freq: Every day | ORAL | 0 refills | Status: DC

## 2018-01-22 NOTE — Telephone Encounter (Signed)
Pt's children dx w/ influenza. Per Dr. Drue NovelPaz okay to send Tamiflu 75mg  1 tab daily # 10 and 0RF.

## 2018-04-01 DIAGNOSIS — F411 Generalized anxiety disorder: Secondary | ICD-10-CM | POA: Diagnosis not present

## 2018-08-06 ENCOUNTER — Encounter: Payer: Self-pay | Admitting: Internal Medicine

## 2018-08-12 DIAGNOSIS — M25862 Other specified joint disorders, left knee: Secondary | ICD-10-CM | POA: Diagnosis not present

## 2018-08-12 DIAGNOSIS — M25562 Pain in left knee: Secondary | ICD-10-CM | POA: Diagnosis not present

## 2018-08-16 DIAGNOSIS — M25562 Pain in left knee: Secondary | ICD-10-CM | POA: Diagnosis not present

## 2018-08-16 DIAGNOSIS — M23352 Other meniscus derangements, posterior horn of lateral meniscus, left knee: Secondary | ICD-10-CM | POA: Diagnosis not present

## 2018-08-25 DIAGNOSIS — S83282D Other tear of lateral meniscus, current injury, left knee, subsequent encounter: Secondary | ICD-10-CM | POA: Diagnosis not present

## 2018-09-12 ENCOUNTER — Encounter: Payer: Self-pay | Admitting: Emergency Medicine

## 2018-09-16 ENCOUNTER — Ambulatory Visit (INDEPENDENT_AMBULATORY_CARE_PROVIDER_SITE_OTHER): Payer: BLUE CROSS/BLUE SHIELD | Admitting: Psychiatry

## 2018-09-16 DIAGNOSIS — F411 Generalized anxiety disorder: Secondary | ICD-10-CM | POA: Diagnosis not present

## 2018-09-16 MED ORDER — ALPRAZOLAM 0.25 MG PO TABS: 0.2500 mg | ORAL_TABLET | Freq: Every evening | ORAL | 3 refills | Status: DC | PRN

## 2018-09-16 MED ORDER — SERTRALINE HCL 100 MG PO TABS: 100.0000 mg | ORAL_TABLET | Freq: Every day | ORAL | 1 refills | Status: DC

## 2018-09-16 NOTE — Progress Notes (Signed)
[facility]  Patient ID: Jorge Schwartz,  MRN: [id number]  PCP: Wanda Plump, MD  Date of Evaluation: 09/16/2018 Time spent:20 minutes   HISTORY/CURRENT STATUS: HPI patient is a 47 year old white male.  04/01/2018.  Anxiety with panic attacks.  Doing well.  No change in medications. Patient continues to do well.  Individual Medical History/ Review of Systems: Changes? :No  Allergies: Patient has no known allergies.  Current Medications:  Current Outpatient Medications:  .  ALPRAZolam (XANAX) 0.25 MG tablet, Take 0.25 mg by mouth at bedtime as needed for anxiety. 1-2 PRN, Disp: , Rfl:  .  azithromycin (ZITHROMAX Z-PAK) 250 MG tablet, 2 tabs a day the first day, then 1 tab a day x 4 days, Disp: 6 tablet, Rfl: 0 .  benzonatate (TESSALON) 200 MG capsule, Take 1 capsule (200 mg total) by mouth 3 (three) times daily as needed for cough., Disp: 21 capsule, Rfl: 0 .  cetirizine (ZYRTEC) 10 MG tablet, Take 10 mg by mouth daily as needed. , Disp: , Rfl:  .  guaiFENesin-codeine 100-10 MG/5ML syrup, Take 5 mLs by mouth at bedtime as needed for cough., Disp: 100 mL, Rfl: 0 .  HYDROcodone-homatropine (HYCODAN) 5-1.5 MG/5ML syrup, Take 5 mLs by mouth at bedtime as needed for cough., Disp: 60 mL, Rfl: 0 .  oseltamivir (TAMIFLU) 75 MG capsule, Take 1 capsule (75 mg total) by mouth daily., Disp: 10 capsule, Rfl: 0 .  sertraline (ZOLOFT) 100 MG tablet, Take 100 mg by mouth daily., Disp: , Rfl:  .  Sertraline HCl (ZOLOFT PO), Take 1 tablet by mouth daily. , Disp: , Rfl:  Medication Side Effects: None  Family Medical/ Social History: Changes? No  MENTAL HEALTH EXAM:  There were no vitals taken for this visit.There is no height or weight on file to calculate BMI.  General Appearance: Casual  Eye Contact:  Good  Speech:  Normal Rate  Volume:  Normal  Mood:  Euthymic  Affect:  Appropriate  Thought Process:  Linear  Orientation:  Full (Time, Place, and Person)  Thought Content: Logical    Suicidal Thoughts:  No  Homicidal Thoughts:  No  Memory:  normal  Judgement:  Good  Insight:  Good  Psychomotor Activity:  Normal  Concentration:  Concentration: Good  Recall:  Good  Fund of Knowledge: Good  Language: Good  Akathisia:  NA  AIMS (if indicated): na  Assets:  Leisure Time  ADL's:  Intact  Cognition: WNL  Prognosis:  Good    DIAGNOSES: anxiety  RECOMMENDATIONS: doing well. Asks about decreasing zoloft. Currently at 100mg /day. No hx of depression. Ok to cut dose in half, 50mg  /day. Xanax prn, zoloft 100mg  1/2 qd    Anne Fu, PA-C

## 2018-09-26 ENCOUNTER — Encounter: Payer: Self-pay | Admitting: Family Medicine

## 2018-09-26 ENCOUNTER — Ambulatory Visit: Payer: BLUE CROSS/BLUE SHIELD | Admitting: Family Medicine

## 2018-09-26 VITALS — BP 124/82 | HR 85 | Temp 98.6°F | Ht 69.5 in | Wt 186.0 lb

## 2018-09-26 DIAGNOSIS — J069 Acute upper respiratory infection, unspecified: Secondary | ICD-10-CM | POA: Diagnosis not present

## 2018-09-26 MED ORDER — AZITHROMYCIN 250 MG PO TABS
ORAL_TABLET | ORAL | 0 refills | Status: DC
Start: 2018-09-26 — End: 2018-10-15

## 2018-09-26 MED ORDER — METHYLPREDNISOLONE ACETATE 80 MG/ML IJ SUSP
80.0000 mg | Freq: Once | INTRAMUSCULAR | Status: AC
  Administered 2018-09-26: 80 mg via INTRAMUSCULAR

## 2018-09-26 NOTE — Progress Notes (Signed)
Pre visit review using our clinic review tool, if applicable. No additional management support is needed unless otherwise documented below in the visit note. 

## 2018-09-26 NOTE — Addendum Note (Signed)
Addended by: Scharlene Gloss B on: 09/26/2018 04:10 PM   Modules accepted: Orders

## 2018-09-26 NOTE — Patient Instructions (Signed)
Continue to push fluids, practice good hand hygiene, and cover your mouth if you cough.  If you start having fevers, shaking or shortness of breath, seek immediate care.  Wait 2-3 days prior to taking antibiotic. Most causes of these URI's are viral and antibiotics do not do anything.  Let us know if you need anything.

## 2018-09-26 NOTE — Progress Notes (Signed)
Chief Complaint  Patient presents with  . Sinusitis    Neta Mends here for URI complaints.  Duration: 1 week   Associated symptoms: sinus congestion, rhinorrhea, shortness of breath and cough Denies: itchy watery eyes, ear pain, ear drainage, sore throat, wheezing, myalgia and fevers Treatment to date: Mucinex Sick contacts: No  ROS:  Const: Denies fevers HEENT: As noted in HPI Lungs: +SOB  Past Medical History:  Diagnosis Date  . No active medical problems     BP 124/82 (BP Location: Right Arm, Patient Position: Sitting, Cuff Size: Normal)   Pulse 85   Temp 98.6 F (37 C) (Oral)   Ht 5' 9.5" (1.765 m)   Wt 186 lb (84.4 kg)   SpO2 96%   BMI 27.07 kg/m  General: Awake, alert, appears stated age HEENT: AT, [place], ears patent b/l and TM's neg, nares patent w/o discharge, pharynx pink and without exudates, no sinus pain, MMM Neck: No masses or asymmetry Heart: RRR Lungs: CTAB, no accessory muscle use Psych: Age appropriate judgment and insight, normal mood and affect  Upper respiratory tract infection, unspecified type  Zpak if no better, wait at least 2-3 days before taking, only if not improving.  Continue to push fluids, practice good hand hygiene, cover mouth when coughing. F/u prn. If starting to experience fevers, shaking, or shortness of breath, seek immediate care. Pt voiced understanding and agreement to the plan.  Jilda Roche [place], DO 09/26/18 4:01 PM

## 2018-10-15 ENCOUNTER — Ambulatory Visit (INDEPENDENT_AMBULATORY_CARE_PROVIDER_SITE_OTHER): Payer: BLUE CROSS/BLUE SHIELD | Admitting: Internal Medicine

## 2018-10-15 VITALS — BP 104/70 | Temp 98.1°F | Resp 16 | Ht 69.5 in | Wt 184.0 lb

## 2018-10-15 DIAGNOSIS — Z0000 Encounter for general adult medical examination without abnormal findings: Secondary | ICD-10-CM | POA: Diagnosis not present

## 2018-10-15 DIAGNOSIS — Z114 Encounter for screening for human immunodeficiency virus [HIV]: Secondary | ICD-10-CM | POA: Diagnosis not present

## 2018-10-15 DIAGNOSIS — Z125 Encounter for screening for malignant neoplasm of prostate: Secondary | ICD-10-CM | POA: Diagnosis not present

## 2018-10-15 DIAGNOSIS — Z23 Encounter for immunization: Secondary | ICD-10-CM

## 2018-10-15 NOTE — Patient Instructions (Signed)
GO TO THE LAB : Get the blood work     GO TO THE FRONT DESK Schedule your next appointment for a  Physical exam in 1 year 

## 2018-10-15 NOTE — Progress Notes (Signed)
Subjective:    Patient ID: Jorge MendsRodrigo Schwartz, male    DOB: 25-Jun-1971, 47 y.o.   MRN: [id number]  DOS:  10/15/2018 Type of visit - description : cpx  Here for physical exam, doing well, fasting  Review of Systems Has some left knee problems, they are thinking about our arthroscopy but for now he is doing okay, remains active.  Other than above, a 14 point review of systems is negative   Past Medical History:  Diagnosis Date  . No active medical problems     Past Surgical History:  Procedure Laterality Date  . NO PAST SURGERIES      Social History   Socioeconomic History  . Marital status: Married    Spouse name: Not on file  . Number of children: 2  . Years of education: Not on file  . Highest education level: Not on file  Occupational History  . Occupation: Doctor, [employer] , supply chain  Social Needs  . Financial resource strain: Not on file  . Food insecurity:    Worry: Not on file    Inability: Not on file  . Transportation needs:    Medical: Not on file    Non-medical: Not on file  Tobacco Use  . Smoking status: Never Smoker  . Smokeless tobacco: Never Used  Substance and Sexual Activity  . Alcohol use: Yes    Comment: socially   . Drug use: Not on file  . Sexual activity: Yes  Lifestyle  . Physical activity:    Days per week: Not on file    Minutes per session: Not on file  . Stress: Not on file  Relationships  . Social connections:    Talks on phone: Not on file    Gets together: Not on file    Attends religious service: Not on file    Active member of club or organization: Not on file    Attends meetings of clubs or organizations: Not on file    Relationship status: Not on file  . Intimate partner violence:    Fear of current or ex partner: Not on file    Emotionally abused: Not on file    Physically abused: Not on file    Forced sexual activity: Not on file  Other Topics Concern  . Not on file  Social History Narrative    Married to ITT IndustriesPaola  [place] with 2 children. Has a masters degree.  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     2 boys (twins) born 2008   Born [place]     Family History  Problem Relation Age of Onset  . Prostate cancer Other        uncles, cousins  . Sudden death Neg Hx   . Colon cancer Neg Hx   . CAD Neg Hx   . Diabetes Neg Hx      Allergies as of 10/15/2018   No Known Allergies     Medication List        Accurate as of 10/15/18 11:59 PM. Always use your most recent med list.          ALPRAZolam 0.25 MG tablet Commonly known as:  XANAX Take 1 tablet (0.25 mg total) by mouth at bedtime as needed for anxiety. 1-2 PRN   cetirizine 10 MG tablet Commonly known as:  ZYRTEC Take 10 mg by mouth daily as needed.   sertraline 100 MG tablet Commonly known as:  ZOLOFT Take 1 tablet (100 mg total)  by mouth daily.           Objective:   Physical Exam BP 104/70 (BP Location: Left Arm, Patient Position: Sitting, Cuff Size: Normal)   Temp 98.1 F (36.7 C)   Resp 16   Ht 5' 9.5" (1.765 m)   Wt 184 lb (83.5 kg)   SpO2 98%   BMI 26.78 kg/m  General: Well developed, NAD, BMI noted Neck: No  thyromegaly  HEENT:  Normocephalic . Face symmetric, atraumatic Lungs:  CTA B Normal respiratory effort, no intercostal retractions, no accessory muscle use. Heart: RRR,  no murmur.  No pretibial edema bilaterally  Abdomen:  Not distended, soft, non-tender. No rebound or rigidity.   Rectal: External abnormalities: none. Normal sphincter tone. No rectal masses or tenderness.  Brown stools Prostate: Prostate gland firm and smooth, no enlargement, nodularity, tenderness, mass, asymmetry or induration Skin: Exposed areas without rash. Not pale. Not jaundice Neurologic:  alert & oriented X3.  Speech normal, gait appropriate for age and unassisted Strength symmetric and appropriate for age.  Psych: Cognition and judgment appear intact.  Cooperative with normal attention span and  concentration.  Behavior appropriate. No anxious or depressed appearing.     Assessment & Plan:   Assessment Anxiety  PLAN:  Anxiety: Follow-up elsewhere, doing great. RTC 1 year

## 2018-10-15 NOTE — Assessment & Plan Note (Signed)
Td << 10 years ago per pt, 2016?  flu shot today CCS: Not indicated Prostate ca screening : Distant family members have prostate cancer, DRE negative, check a PSA Labs: CMP, FLP, CBC, TSH, PSA, HIV Diet and exercise discussed

## 2018-10-16 LAB — LIPID PANEL
CHOL/HDL RATIO: 3
CHOLESTEROL: 160 mg/dL (ref 0–200)
HDL: 50.6 mg/dL (ref >39.00)
LDL Cholesterol: 92 mg/dL (ref 0–99)
NonHDL: 109.87
TRIGLYCERIDES: 88 mg/dL (ref 0.0–149.0)
VLDL: 17.6 mg/dL (ref 0.0–40.0)

## 2018-10-16 LAB — COMPREHENSIVE METABOLIC PANEL
ALK PHOS: 55 U/L (ref 39–117)
ALT: 13 U/L (ref 0–53)
AST: 19 U/L (ref 0–37)
Albumin: 4.5 g/dL (ref 3.5–5.2)
BILIRUBIN TOTAL: 0.8 mg/dL (ref 0.2–1.2)
BUN: 10 mg/dL (ref 6–23)
CO2: 29 meq/L (ref 19–32)
CREATININE: 0.96 mg/dL (ref 0.40–1.50)
Calcium: 9.4 mg/dL (ref 8.4–10.5)
Chloride: 102 mEq/L (ref 96–112)
GFR: 89.23 mL/min (ref >60.00)
GLUCOSE: 92 mg/dL (ref 70–99)
Potassium: 4.4 mEq/L (ref 3.5–5.1)
Sodium: 138 mEq/L (ref 135–145)
TOTAL PROTEIN: 6.9 g/dL (ref 6.0–8.3)

## 2018-10-16 LAB — CBC WITH DIFFERENTIAL/PLATELET
BASOS ABS: 0.1 10*3/uL (ref 0.0–0.1)
BASOS PCT: 1.5 % (ref 0.0–3.0)
Eosinophils Absolute: 0.2 10*3/uL (ref 0.0–0.7)
Eosinophils Relative: 1.9 % (ref 0.0–5.0)
HEMATOCRIT: 40.2 % (ref 39.0–52.0)
Hemoglobin: 13.5 g/dL (ref 13.0–17.0)
LYMPHS ABS: 2.4 10*3/uL (ref 0.7–4.0)
LYMPHS PCT: 28.7 % (ref 12.0–46.0)
MCHC: 33.7 g/dL (ref 30.0–36.0)
MCV: 86.6 fl (ref 78.0–100.0)
Monocytes Absolute: 0.5 10*3/uL (ref 0.1–1.0)
Monocytes Relative: 5.8 % (ref 3.0–12.0)
NEUTROS ABS: 5.1 10*3/uL (ref 1.4–7.7)
NEUTROS PCT: 62.1 % (ref 43.0–77.0)
PLATELETS: 396 10*3/uL (ref 150.0–400.0)
RBC: 4.64 Mil/uL (ref 4.22–5.81)
RDW: 13.7 % (ref 11.5–15.5)
WBC: 8.3 10*3/uL (ref 4.0–10.5)

## 2018-10-16 LAB — HIV ANTIBODY (ROUTINE TESTING W REFLEX): HIV 1&2 Ab, 4th Generation: NONREACTIVE

## 2018-10-16 LAB — TSH: TSH: 2.23 u[IU]/mL (ref 0.35–4.50)

## 2018-10-16 LAB — PSA: PSA: 1.24 ng/mL (ref 0.10–4.00)

## 2018-10-16 NOTE — Assessment & Plan Note (Signed)
Anxiety: Follow-up elsewhere, doing great. RTC 1 year

## 2019-01-13 DIAGNOSIS — M5116 Intervertebral disc disorders with radiculopathy, lumbar region: Secondary | ICD-10-CM | POA: Diagnosis not present

## 2019-01-13 DIAGNOSIS — M5442 Lumbago with sciatica, left side: Secondary | ICD-10-CM | POA: Diagnosis not present

## 2019-01-13 DIAGNOSIS — M5416 Radiculopathy, lumbar region: Secondary | ICD-10-CM | POA: Diagnosis not present

## 2019-03-24 ENCOUNTER — Other Ambulatory Visit: Payer: Self-pay

## 2019-03-24 MED ORDER — SERTRALINE HCL 100 MG PO TABS: 100.0000 mg | ORAL_TABLET | Freq: Every day | ORAL | 2 refills | Status: DC

## 2019-09-21 ENCOUNTER — Ambulatory Visit: Payer: BLUE CROSS/BLUE SHIELD | Admitting: Psychiatry

## 2019-10-04 DIAGNOSIS — Z20828 Contact with and (suspected) exposure to other viral communicable diseases: Secondary | ICD-10-CM | POA: Diagnosis not present

## 2019-12-20 ENCOUNTER — Other Ambulatory Visit: Payer: Self-pay | Admitting: Psychiatry

## 2019-12-21 NOTE — Telephone Encounter (Signed)
Needs apt

## 2019-12-30 ENCOUNTER — Ambulatory Visit (INDEPENDENT_AMBULATORY_CARE_PROVIDER_SITE_OTHER): Payer: BC Managed Care – PPO | Admitting: Adult Health

## 2019-12-30 ENCOUNTER — Other Ambulatory Visit: Payer: Self-pay

## 2019-12-30 ENCOUNTER — Encounter: Payer: Self-pay | Admitting: Adult Health

## 2019-12-30 DIAGNOSIS — F411 Generalized anxiety disorder: Secondary | ICD-10-CM | POA: Diagnosis not present

## 2019-12-30 DIAGNOSIS — F40243 Fear of flying: Secondary | ICD-10-CM

## 2019-12-30 DIAGNOSIS — F410 Panic disorder [episodic paroxysmal anxiety] without agoraphobia: Secondary | ICD-10-CM | POA: Diagnosis not present

## 2019-12-30 MED ORDER — ALPRAZOLAM 0.25 MG PO TABS: 0.2500 mg | ORAL_TABLET | Freq: Every evening | ORAL | 3 refills | Status: DC | PRN

## 2019-12-30 MED ORDER — SERTRALINE HCL 100 MG PO TABS: 100.0000 mg | ORAL_TABLET | Freq: Every day | ORAL | 3 refills | Status: DC

## 2019-12-30 NOTE — Progress Notes (Signed)
Jorge Schwartz [id number] [birth year] 49 y.o.  Virtual Visit via Telephone Note  I connected with pt on 12/30/19 at  9:40 AM EST by telephone and verified that I am speaking with the correct person using two identifiers.   I discussed the limitations, risks, security and privacy concerns of performing an evaluation and management service by telephone and the availability of in person appointments. I also discussed with the patient that there may be a patient responsible charge related to this service. The patient expressed understanding and agreed to proceed.   I discussed the assessment and treatment plan with the patient. The patient was provided an opportunity to ask questions and all were answered. The patient agreed with the plan and demonstrated an understanding of the instructions.   The patient was advised to call back or seek an in-person evaluation if the symptoms worsen or if the condition fails to improve as anticipated.  I provided 30 minutes of non-face-to-face time during this encounter.  The patient was located at home.  The provider was located at [facility].   Dorothyann Gibbs, NP   Subjective:   Patient ID:  Jorge Schwartz is a 49 y.o. (DOB [birth year]) male.  Chief Complaint:  Chief Complaint  Patient presents with  . Panic Attack  . Anxiety    HPI Jorge Schwartz presents for follow-up of anxiety, fear of lying, and panic attacks.  Describes mood today as "ok". Pleasant. Mood symptoms - denies depression, anxiety, and irritability. Stating "I'm doing pretty good". He and family during well. Stable interest and motivation. Taking medications as prescribed.  Energy levels stable. Active, does not have a regular exercise routine. Works full-time.   Enjoys some usual interests and activities. Married. Lives with wife of 20 years - has 51-71 year old sons. No family local. Appetite adequate. Weight stable. Sleeps well most nights. Averages 7 to 8  hours. Focus and concentration stable. Completing tasks. Managing aspects of household. Work going well Control and instrumentation engineer. Denies SI or HI. Denies AH or VH.  Previous medication trials: Clonazepam  Review of Systems:  Review of Systems  Musculoskeletal: Negative for gait problem.  Neurological: Negative for tremors.  Psychiatric/Behavioral:       Please refer to HPI    Medications: I have reviewed the patient's current medications.  Current Outpatient Medications  Medication Sig Dispense Refill  . ALPRAZolam (XANAX) 0.25 MG tablet Take 1 tablet (0.25 mg total) by mouth at bedtime as needed for anxiety. 1-2 PRN 30 tablet 3  . cetirizine (ZYRTEC) 10 MG tablet Take 10 mg by mouth daily as needed.     . sertraline (ZOLOFT) 100 MG tablet Take 1 tablet (100 mg total) by mouth daily. 90 tablet 3   No current facility-administered medications for this visit.    Medication Side Effects: None  Allergies: No Known Allergies  Past Medical History:  Diagnosis Date  . No active medical problems     Family History  Problem Relation Age of Onset  . Prostate cancer Other        uncles, cousins  . Sudden death Neg Hx   . Colon cancer Neg Hx   . CAD Neg Hx   . Diabetes Neg Hx     Social History   Socioeconomic History  . Marital status: Married    Spouse name: Not on file  . Number of children: 2  . Years of education: Not on file  . Highest education level: Not on file  Occupational History  .  Occupation: [employer] , supply chain  Tobacco Use  . Smoking status: Never Smoker  . Smokeless tobacco: Never Used  Substance and Sexual Activity  . Alcohol use: Yes    Comment: socially   . Drug use: Not on file  . Sexual activity: Yes  Other Topics Concern  . Not on file  Social History Narrative    Married to [employer] with 2 children. Has a masters degree.  He is a Freight forwarder for [employer].     2 boys (twins) born 2008   Born in [place]   Social Determinants of Health    Financial Resource Strain:   . Difficulty of Paying Living Expenses: Not on file  Food Insecurity:   . Worried About Charity fundraiser in the Last Year: Not on file  . Ran Out of Food in the Last Year: Not on file  Transportation Needs:   . Lack of Transportation (Medical): Not on file  . Lack of Transportation (Non-Medical): Not on file  Physical Activity:   . Days of Exercise per Week: Not on file  . Minutes of Exercise per Session: Not on file  Stress:   . Feeling of Stress : Not on file  Social Connections:   . Frequency of Communication with Friends and Family: Not on file  . Frequency of Social Gatherings with Friends and Family: Not on file  . Attends Religious Services: Not on file  . Active Member of Clubs or Organizations: Not on file  . Attends Archivist Meetings: Not on file  . Marital Status: Not on file  Intimate Partner Violence:   . Fear of Current or Ex-Partner: Not on file  . Emotionally Abused: Not on file  . Physically Abused: Not on file  . Sexually Abused: Not on file    Past Medical History, Surgical history, Social history, and Family history were reviewed and updated as appropriate.   Please see review of systems for further details on the patient's review from today.   Objective:   Physical Exam:  There were no vitals taken for this visit.  Physical Exam Constitutional:      General: He is not in acute distress.    Appearance: He is well-developed.  Musculoskeletal:        General: No deformity.  Neurological:     Mental Status: He is alert and oriented to person, place, and time.     Coordination: Coordination normal.  Psychiatric:        Attention and Perception: Attention and perception normal. He does not perceive auditory or visual hallucinations.        Mood and Affect: Mood normal. Mood is not anxious or depressed. Affect is not labile, blunt, angry or inappropriate.        Speech: Speech normal.        Behavior:  Behavior normal.        Thought Content: Thought content normal. Thought content is not paranoid or delusional. Thought content does not include homicidal or suicidal ideation. Thought content does not include homicidal or suicidal plan.        Cognition and Memory: Cognition and memory normal.        Judgment: Judgment normal.     Comments: Insight intact     Lab Review:     Component Value Date/Time   NA 138 10/15/2018 1430   K 4.4 10/15/2018 1430   CL 102 10/15/2018 1430   CO2 29 10/15/2018 1430  GLUCOSE 92 10/15/2018 1430   BUN 10 10/15/2018 1430   CREATININE 0.96 10/15/2018 1430   CALCIUM 9.4 10/15/2018 1430   PROT 6.9 10/15/2018 1430   ALBUMIN 4.5 10/15/2018 1430   AST 19 10/15/2018 1430   ALT 13 10/15/2018 1430   ALKPHOS 55 10/15/2018 1430   BILITOT 0.8 10/15/2018 1430       Component Value Date/Time   WBC 8.3 10/15/2018 1430   RBC 4.64 10/15/2018 1430   HGB 13.5 10/15/2018 1430   HCT 40.2 10/15/2018 1430   PLT 396.0 10/15/2018 1430   MCV 86.6 10/15/2018 1430   MCHC 33.7 10/15/2018 1430   RDW 13.7 10/15/2018 1430   LYMPHSABS 2.4 10/15/2018 1430   MONOABS 0.5 10/15/2018 1430   EOSABS 0.2 10/15/2018 1430   BASOSABS 0.1 10/15/2018 1430    No results found for: POCLITH, LITHIUM   No results found for: PHENYTOIN, PHENOBARB, VALPROATE, CBMZ   .res Assessment: Plan:    Plan: PDMP reviewed  1. Continue Zoloft 100mg  daily 2. Xanax 0.25mg  daily prn - uses infrequently.    RTC 1 year  Patient advised to contact office with any questions, adverse effects, or acute worsening in signs and symptoms.  Discussed potential benefits, risk, and side effects of benzodiazepines to include potential risk of tolerance and dependence, as well as possible drowsiness.  Advised patient not to drive if experiencing drowsiness and to take lowest possible effective dose to minimize risk of dependence and tolerance.    Jorge Schwartz was seen today for panic attack and  anxiety.  Diagnoses and all orders for this visit:  Generalized anxiety disorder -     ALPRAZolam (XANAX) 0.25 MG tablet; Take 1 tablet (0.25 mg total) by mouth at bedtime as needed for anxiety. 1-2 PRN -     sertraline (ZOLOFT) 100 MG tablet; Take 1 tablet (100 mg total) by mouth daily.  Panic attacks -     ALPRAZolam (XANAX) 0.25 MG tablet; Take 1 tablet (0.25 mg total) by mouth at bedtime as needed for anxiety. 1-2 PRN -     sertraline (ZOLOFT) 100 MG tablet; Take 1 tablet (100 mg total) by mouth daily.  Fear of flying    Please see After Visit Summary for patient specific instructions.  Future Appointments  Date Time Provider Department Center  12/29/2020  8:00 AM Lakysha Kossman, 02/26/2021, NP [facility] None    No orders of the defined types were placed in this encounter.     -------------------------------

## 2020-03-03 DIAGNOSIS — E291 Testicular hypofunction: Secondary | ICD-10-CM | POA: Diagnosis not present

## 2020-03-03 DIAGNOSIS — R635 Abnormal weight gain: Secondary | ICD-10-CM | POA: Diagnosis not present

## 2020-03-03 DIAGNOSIS — R790 Abnormal level of blood mineral: Secondary | ICD-10-CM | POA: Diagnosis not present

## 2020-03-03 DIAGNOSIS — R5382 Chronic fatigue, unspecified: Secondary | ICD-10-CM | POA: Diagnosis not present

## 2020-03-03 DIAGNOSIS — E559 Vitamin D deficiency, unspecified: Secondary | ICD-10-CM | POA: Diagnosis not present

## 2020-03-08 DIAGNOSIS — E291 Testicular hypofunction: Secondary | ICD-10-CM | POA: Diagnosis not present

## 2020-03-08 DIAGNOSIS — E7800 Pure hypercholesterolemia, unspecified: Secondary | ICD-10-CM | POA: Diagnosis not present

## 2020-03-08 DIAGNOSIS — Z1331 Encounter for screening for depression: Secondary | ICD-10-CM | POA: Diagnosis not present

## 2020-03-08 DIAGNOSIS — E559 Vitamin D deficiency, unspecified: Secondary | ICD-10-CM | POA: Diagnosis not present

## 2020-03-08 DIAGNOSIS — Z6829 Body mass index (BMI) 29.0-29.9, adult: Secondary | ICD-10-CM | POA: Diagnosis not present

## 2020-03-11 DIAGNOSIS — E291 Testicular hypofunction: Secondary | ICD-10-CM | POA: Diagnosis not present

## 2020-03-11 DIAGNOSIS — Z72820 Sleep deprivation: Secondary | ICD-10-CM | POA: Diagnosis not present

## 2020-03-11 DIAGNOSIS — R6882 Decreased libido: Secondary | ICD-10-CM | POA: Diagnosis not present

## 2020-03-21 DIAGNOSIS — E559 Vitamin D deficiency, unspecified: Secondary | ICD-10-CM | POA: Diagnosis not present

## 2020-03-21 DIAGNOSIS — Z6829 Body mass index (BMI) 29.0-29.9, adult: Secondary | ICD-10-CM | POA: Diagnosis not present

## 2020-03-28 DIAGNOSIS — Z6829 Body mass index (BMI) 29.0-29.9, adult: Secondary | ICD-10-CM | POA: Diagnosis not present

## 2020-03-28 DIAGNOSIS — E7800 Pure hypercholesterolemia, unspecified: Secondary | ICD-10-CM | POA: Diagnosis not present

## 2020-04-05 DIAGNOSIS — E559 Vitamin D deficiency, unspecified: Secondary | ICD-10-CM | POA: Diagnosis not present

## 2020-04-05 DIAGNOSIS — Z6829 Body mass index (BMI) 29.0-29.9, adult: Secondary | ICD-10-CM | POA: Diagnosis not present

## 2020-04-13 DIAGNOSIS — R6882 Decreased libido: Secondary | ICD-10-CM | POA: Diagnosis not present

## 2020-04-13 DIAGNOSIS — E7800 Pure hypercholesterolemia, unspecified: Secondary | ICD-10-CM | POA: Diagnosis not present

## 2020-04-13 DIAGNOSIS — Z72820 Sleep deprivation: Secondary | ICD-10-CM | POA: Diagnosis not present

## 2020-04-13 DIAGNOSIS — E291 Testicular hypofunction: Secondary | ICD-10-CM | POA: Diagnosis not present

## 2020-04-13 DIAGNOSIS — R790 Abnormal level of blood mineral: Secondary | ICD-10-CM | POA: Diagnosis not present

## 2020-04-13 DIAGNOSIS — Z6828 Body mass index (BMI) 28.0-28.9, adult: Secondary | ICD-10-CM | POA: Diagnosis not present

## 2020-04-13 DIAGNOSIS — E559 Vitamin D deficiency, unspecified: Secondary | ICD-10-CM | POA: Diagnosis not present

## 2020-04-18 DIAGNOSIS — E559 Vitamin D deficiency, unspecified: Secondary | ICD-10-CM | POA: Diagnosis not present

## 2020-04-18 DIAGNOSIS — R790 Abnormal level of blood mineral: Secondary | ICD-10-CM | POA: Diagnosis not present

## 2020-04-18 DIAGNOSIS — Z6828 Body mass index (BMI) 28.0-28.9, adult: Secondary | ICD-10-CM | POA: Diagnosis not present

## 2020-04-18 DIAGNOSIS — Z72820 Sleep deprivation: Secondary | ICD-10-CM | POA: Diagnosis not present

## 2020-04-29 DIAGNOSIS — Z6828 Body mass index (BMI) 28.0-28.9, adult: Secondary | ICD-10-CM | POA: Diagnosis not present

## 2020-04-29 DIAGNOSIS — E7800 Pure hypercholesterolemia, unspecified: Secondary | ICD-10-CM | POA: Diagnosis not present

## 2020-05-27 DIAGNOSIS — Z6828 Body mass index (BMI) 28.0-28.9, adult: Secondary | ICD-10-CM | POA: Diagnosis not present

## 2020-05-27 DIAGNOSIS — R790 Abnormal level of blood mineral: Secondary | ICD-10-CM | POA: Diagnosis not present

## 2020-06-03 DIAGNOSIS — Z6828 Body mass index (BMI) 28.0-28.9, adult: Secondary | ICD-10-CM | POA: Diagnosis not present

## 2020-06-03 DIAGNOSIS — E7800 Pure hypercholesterolemia, unspecified: Secondary | ICD-10-CM | POA: Diagnosis not present

## 2020-06-16 DIAGNOSIS — R790 Abnormal level of blood mineral: Secondary | ICD-10-CM | POA: Diagnosis not present

## 2020-06-16 DIAGNOSIS — Z6827 Body mass index (BMI) 27.0-27.9, adult: Secondary | ICD-10-CM | POA: Diagnosis not present

## 2020-06-16 DIAGNOSIS — E559 Vitamin D deficiency, unspecified: Secondary | ICD-10-CM | POA: Diagnosis not present

## 2020-07-11 ENCOUNTER — Encounter: Payer: Self-pay | Admitting: Internal Medicine

## 2020-07-12 ENCOUNTER — Telehealth: Payer: Self-pay | Admitting: Internal Medicine

## 2020-07-12 NOTE — Telephone Encounter (Signed)
Left voicemail to schedule appointment

## 2020-07-12 NOTE — Telephone Encounter (Signed)
-----   Message from Wanda Plump, MD sent at 07/11/2020  5:18 PM EDT ----- Regarding: Appointment Mitzo: See patient's message, I think I have an opening this week, please arrange office visit with me.If he just like to see cardiology, let me know and we will arrange  CC: [facility]

## 2020-07-13 DIAGNOSIS — E291 Testicular hypofunction: Secondary | ICD-10-CM | POA: Diagnosis not present

## 2020-07-13 DIAGNOSIS — R5382 Chronic fatigue, unspecified: Secondary | ICD-10-CM | POA: Diagnosis not present

## 2020-07-15 ENCOUNTER — Encounter: Payer: Self-pay | Admitting: Internal Medicine

## 2020-07-15 ENCOUNTER — Other Ambulatory Visit: Payer: Self-pay

## 2020-07-15 ENCOUNTER — Ambulatory Visit (INDEPENDENT_AMBULATORY_CARE_PROVIDER_SITE_OTHER): Payer: BC Managed Care – PPO | Admitting: Internal Medicine

## 2020-07-15 ENCOUNTER — Ambulatory Visit (HOSPITAL_BASED_OUTPATIENT_CLINIC_OR_DEPARTMENT_OTHER)
Admission: RE | Admit: 2020-07-15 | Discharge: 2020-07-15 | Disposition: A | Discharge status: Home / Self Care | Payer: BC Managed Care – PPO | Source: Ambulatory Visit | Attending: Internal Medicine | Admitting: Internal Medicine

## 2020-07-15 VITALS — BP 111/67 | HR 66 | Temp 97.8°F | Resp 16 | Ht 69.5 in | Wt 201.0 lb

## 2020-07-15 DIAGNOSIS — R5382 Chronic fatigue, unspecified: Secondary | ICD-10-CM | POA: Diagnosis not present

## 2020-07-15 DIAGNOSIS — R079 Chest pain, unspecified: Secondary | ICD-10-CM | POA: Diagnosis not present

## 2020-07-15 DIAGNOSIS — J90 Pleural effusion, not elsewhere classified: Secondary | ICD-10-CM | POA: Diagnosis not present

## 2020-07-15 DIAGNOSIS — I10 Essential (primary) hypertension: Secondary | ICD-10-CM | POA: Insufficient documentation

## 2020-07-15 DIAGNOSIS — M2559 Pain in other specified joint: Secondary | ICD-10-CM | POA: Diagnosis not present

## 2020-07-15 DIAGNOSIS — E291 Testicular hypofunction: Secondary | ICD-10-CM | POA: Diagnosis not present

## 2020-07-15 LAB — COMPREHENSIVE METABOLIC PANEL
AG Ratio: 2 (calc) (ref 1.0–2.5)
ALT: 11 U/L (ref 9–46)
AST: 14 U/L (ref 10–40)
Albumin: 4.1 g/dL (ref 3.6–5.1)
Alkaline phosphatase (APISO): 64 U/L (ref 36–130)
BUN: 16 mg/dL (ref 7–25)
CO2: 26 mmol/L (ref 20–32)
Calcium: 9 mg/dL (ref 8.6–10.3)
Chloride: 105 mmol/L (ref 98–110)
Creat: 1.04 mg/dL (ref 0.60–1.35)
Globulin: 2.1 g/dL (calc) (ref 1.9–3.7)
Glucose, Bld: 86 mg/dL (ref 65–99)
Potassium: 4.1 mmol/L (ref 3.5–5.3)
Sodium: 139 mmol/L (ref 135–146)
Total Bilirubin: 0.5 mg/dL (ref 0.2–1.2)
Total Protein: 6.2 g/dL (ref 6.1–8.1)

## 2020-07-15 LAB — CBC WITH DIFFERENTIAL/PLATELET
Absolute Monocytes: 531 cells/uL (ref 200–950)
Basophils Absolute: 50 cells/uL (ref 0–200)
Basophils Relative: 0.6 %
Eosinophils Absolute: 216 cells/uL (ref 15–500)
Eosinophils Relative: 2.6 %
HCT: 40.7 % (ref 38.5–50.0)
Hemoglobin: 13.8 g/dL (ref 13.2–17.1)
Lymphs Abs: 2133 cells/uL (ref 850–3900)
MCH: 28.9 pg (ref 27.0–33.0)
MCHC: 33.9 g/dL (ref 32.0–36.0)
MCV: 85.1 fL (ref 80.0–100.0)
MPV: 10.6 fL (ref 7.5–12.5)
Monocytes Relative: 6.4 %
Neutro Abs: 5370 cells/uL (ref 1500–7800)
Neutrophils Relative %: 64.7 %
Platelets: 369 10*3/uL (ref 140–400)
RBC: 4.78 10*6/uL (ref 4.20–5.80)
RDW: 13 % (ref 11.0–15.0)
Total Lymphocyte: 25.7 %
WBC: 8.3 10*3/uL (ref 3.8–10.8)

## 2020-07-15 MED ORDER — TELMISARTAN 40 MG PO TABS
40.0000 mg | ORAL_TABLET | Freq: Every day | ORAL | 1 refills | Status: DC
Start: 2020-07-15 — End: 2020-08-15

## 2020-07-15 NOTE — Patient Instructions (Addendum)
Continue telmisartan  Low-salt diet  Check the  blood pressure 2 or 3 times a week  BP GOAL is between 110/65 and  135/85. If it is consistently higher or lower, let me know    GO TO THE FRONT DESK, PLEASE SCHEDULE YOUR APPOINTMENTS Come back for a physical exam in 6 weeks  STOP BY THE FIRST FLOOR:  get the XR

## 2020-07-15 NOTE — Progress Notes (Signed)
Subjective:    Patient ID: Jorge Schwartz, male    DOB: January 02, 1971, 49 y.o.   MRN: [id number]  DOS:  07/15/2020 Type of visit - description: Follow-up  The patient was recently in [place], he had a very heavy dinner with fatty and salty foods. Went to sleep and was awakened by a chest discomfort: It was a pressure, middle of the chest with some radiation to the throat. Felt like indigestion to him, took Alka-Seltzer without help. Went to the ER. Upon arrival blood pressure was elevated to 169/100. BP was decreased with IV and other medications. As soon as the BP started to decrease he felt much better and is currently asymptomatic.  He saw cardiology at the ER and subsequently had a treadmill stress test: Negative.  Review of Systems No fever chills, no nausea or vomiting. No reflux type of symptoms No cough No calf or leg swelling. Chest discomfort was not ripping or sharp.   Past Medical History:  Diagnosis Date  . No active medical problems     Past Surgical History:  Procedure Laterality Date  . NO PAST SURGERIES      Allergies as of 07/15/2020   No Known Allergies     Medication List       Accurate as of July 15, 2020  3:49 PM. If you have any questions, ask your nurse or doctor.        ALPRAZolam 0.25 MG tablet Commonly known as: XANAX Take 1 tablet (0.25 mg total) by mouth at bedtime as needed for anxiety. 1-2 PRN   cetirizine 10 MG tablet Commonly known as: ZYRTEC Take 10 mg by mouth daily as needed.   sertraline 100 MG tablet Commonly known as: ZOLOFT Take 1 tablet (100 mg total) by mouth daily.   telmisartan 40 MG tablet Commonly known as: MICARDIS Take 40 mg by mouth daily.          Objective:   Physical Exam BP 111/67 (BP Location: Right Arm, Patient Position: Sitting, Cuff Size: Normal)   Pulse 66   Temp 97.8 F (36.6 C) (Oral)   Resp 16   Ht 5' 9.5" (1.765 m)   Wt 201 lb (91.2 kg)   SpO2 98%   BMI 29.26 kg/m  General:     Well developed, NAD, BMI noted.  HEENT:  Normocephalic . Face symmetric, atraumatic Lungs:  CTA B Normal respiratory effort, no intercostal retractions, no accessory muscle use. Heart: RRR,  no murmur.  Abdomen:  Not distended, soft, non-tender. No rebound or rigidity.   Skin: Not pale. Not jaundice Lower extremities: no pretibial edema bilaterally  Neurologic:  alert & oriented X3.  Speech normal, gait appropriate for age and unassisted Psych--  Cognition and judgment appear intact.  Cooperative with normal attention span and concentration.  Behavior appropriate. No anxious or depressed appearing.     Assessment     Assessment Anxiety  PLAN:  Hypertensive urgency Patient was visiting [place], developed high blood pressure (for the first time) and chest pressure after a heavy meal rich in salt.  Went to the ER, was found to be hypertensive, as soon as BP was lowered, chest pain resolved.  Has not returned. He denies GI symptoms, calf pain or swelling  Records from [place] (in [place]) reviewed.  07/01/2020: CBC normal, troponin negative, glucose 107, total cholesterol 173, triglyceride 155. Records indicate EKG showed bradycardia. Had a treadmill stress test (after he was treated in the ER and BP was controlled) normal. No  chest x-ray found  Today he feels well, physical exam is normal. EKG today NSR. No family history of CAD Plan:  Continue telmisartan but watch closely for overcontrolled BP, okay to decrease or stop telmisartan if necessary. CMP and CBC today Chest x-ray to assess mediastinum although chest pain was not ripping. RTC 6 weeks.    This visit occurred during the SARS-CoV-2 public health emergency.  Safety protocols were in place, including screening questions prior to the visit, additional usage of staff PPE, and extensive cleaning of exam room while observing appropriate contact time as indicated for disinfecting solutions.

## 2020-07-15 NOTE — Progress Notes (Signed)
Pre visit review using our clinic review tool, if applicable. No additional management support is needed unless otherwise documented below in the visit note. 

## 2020-07-17 NOTE — Assessment & Plan Note (Signed)
Hypertensive urgency Patient was visiting [place], developed high blood pressure (for the first time) and chest pressure after a heavy meal rich in salt.  Went to the ER, was found to be hypertensive, as soon as BP was lowered, chest pain resolved.  Has not returned. He denies GI symptoms, calf pain or swelling  Records from [place] (in [place]) reviewed.  07/01/2020: CBC normal, troponin negative, glucose 107, total cholesterol 173, triglyceride 155. Records indicate EKG showed bradycardia. Had a treadmill stress test (after he was treated in the ER and BP was controlled) normal. No chest x-ray found  Today he feels well, physical exam is normal. EKG today NSR. No family history of CAD Plan:  Continue telmisartan but watch closely for overcontrolled BP, okay to decrease or stop telmisartan if necessary. CMP and CBC today Chest x-ray to assess mediastinum although chest pain was not ripping. RTC 6 weeks.

## 2020-08-15 ENCOUNTER — Other Ambulatory Visit: Payer: Self-pay | Admitting: Internal Medicine

## 2020-08-26 DIAGNOSIS — U071 COVID-19: Secondary | ICD-10-CM | POA: Insufficient documentation

## 2020-08-26 HISTORY — DX: COVID-19: U07.1

## 2020-09-13 ENCOUNTER — Encounter: Payer: BC Managed Care – PPO | Admitting: Internal Medicine

## 2020-09-19 ENCOUNTER — Telehealth: Payer: Self-pay | Admitting: Medical

## 2020-09-19 ENCOUNTER — Encounter: Payer: Self-pay | Admitting: Medical

## 2020-09-19 ENCOUNTER — Telehealth (INDEPENDENT_AMBULATORY_CARE_PROVIDER_SITE_OTHER): Payer: BC Managed Care – PPO | Admitting: Medical

## 2020-09-19 ENCOUNTER — Other Ambulatory Visit: Payer: Self-pay

## 2020-09-19 VITALS — BP 130/85 | HR 99 | Temp 101.3°F

## 2020-09-19 DIAGNOSIS — J40 Bronchitis, not specified as acute or chronic: Secondary | ICD-10-CM | POA: Diagnosis not present

## 2020-09-19 DIAGNOSIS — U071 COVID-19: Secondary | ICD-10-CM

## 2020-09-19 DIAGNOSIS — R059 Cough, unspecified: Secondary | ICD-10-CM | POA: Diagnosis not present

## 2020-09-19 DIAGNOSIS — M791 Myalgia, unspecified site: Secondary | ICD-10-CM | POA: Diagnosis not present

## 2020-09-19 MED ORDER — AZITHROMYCIN 250 MG PO TABS: ORAL_TABLET | ORAL | 0 refills | Status: DC

## 2020-09-19 MED ORDER — FLUTICASONE PROPIONATE 50 MCG/ACT NA SUSP: 2.0000 | Freq: Every day | NASAL | 1 refills | Status: DC

## 2020-09-19 MED ORDER — BENZONATATE 100 MG PO CAPS: 100.0000 mg | ORAL_CAPSULE | Freq: Three times a day (TID) | ORAL | 0 refills | Status: DC | PRN

## 2020-09-19 NOTE — Telephone Encounter (Signed)
Pt called and stated he did two rapid Covid tests and both came back positive.  He is wondering what his next steps are.  I did let pt know you will respond to his Mychart message and that you are currently in with patients.

## 2020-09-19 NOTE — Patient Instructions (Addendum)
Patient has recent fever, bronchitis symptoms, cough and myalgias.  Recent trip to [place] and symptoms started last Saturday.  Earlier today I prescribed azithromycin antibiotic, Flonase nasal spray and benzonatate cough tablets.  Later in the day got my chart result that his 2 rapid Covid test came back positive.  Tried to call patient directly afterwards but was unable to contact patient directly.  So I sent him in depth my chart message.   Also sent message to to MA/staff members to call the monoclonal body infusion center to see if he qualifies based on timing of infection, risk factors and recent symptoms.  Will await patient's response to a MyChart message regarding his O2 sat levels.  Also hopefully will get a relatively quick answer regarding possible monoclonal body infusions.  Follow-up 7  days or as needed.

## 2020-09-19 NOTE — Progress Notes (Signed)
   Subjective:    Patient ID: Jorge Schwartz, male    DOB: 1971/04/07, 49 y.o.   MRN: [id number]  HPI  Virtual Visit via Video Note  I connected with Neta Mends on 09/19/20 at  9:20 AM EDT by a video enabled telemedicine application and verified that I am speaking with the correct person using two identifiers.  Location: Patient: home Provider: office.   I discussed the limitations of evaluation and management by telemedicine and the availability of in person appointments. The patient expressed understanding and agreed to proceed.  History of Present Illness:  Pt on Saturday got nasal congestion and cough. He was working in yard. Then later in day fever, nasal congested, chest congestion and productive.  Pt has some body aches all over. Arms, legs and neck.  No changes to smell.  Pt has been vaccinated again covid. He got 2 shot phizer series. Pt has not had flu vaccine.       Observations/Objective: General-no acute distress, pleasant, oriented. Lungs- on inspection lungs appear unlabored. Neck- no tracheal deviation or jvd on inspection. Neuro- gross motor function appears intact. heent-no sinus pressure.  Assessment and Plan: Patient has recent fever, bronchitis symptoms, cough and myalgias.  Recent trip to [place] and symptoms started last Saturday.  Earlier today I prescribed azithromycin antibiotic, Flonase nasal spray and benzonatate cough tablets.  Later in the day got my chart result that his 2 rapid Covid test came back positive.  Tried to call patient directly afterwards but was unable to contact patient directly.  So I sent him in depth my chart message.   Also sent message to to MA/staff members to call the monoclonal body infusion center to see if he qualifies based on timing of infection, risk factors and recent symptoms.  Will await patient's response to a MyChart message regarding his O2 sat levels.  Also hopefully will get a relatively quick  answer regarding possible monoclonal body infusions.  Follow-up 7 days or as needed.  Time spent with patient today was 30 + minutes which consisted of chart review, discussing diagnoses, work up, treatment, coordinating care after + covid results and documentation.  Follow Up Instructions:    I discussed the assessment and treatment plan with the patient. The patient was provided an opportunity to ask questions and all were answered. The patient agreed with the plan and demonstrated an understanding of the instructions.   The patient was advised to call back or seek an in-person evaluation if the symptoms worsen or if the condition fails to improve as anticipated.     Esperanza Richters, PA-C   Review of Systems     Objective:   Physical Exam        Assessment & Plan:

## 2020-09-19 NOTE — Telephone Encounter (Addendum)
Pt of Dr. Drue Novel who I saw today virtual visit. Pt has tested + for covid(2 + rapid test). 49 year old with cough, fever ,nasal congestion,chest congestion and myalgias.  Started to have symptoms on Saturday. Formerly vaccinated in the spring.   Will you call outpt monoclonal antibody infusion center and see if he qualitfes. [phone]  49 yo male with htn.

## 2020-09-20 ENCOUNTER — Other Ambulatory Visit: Payer: Self-pay | Admitting: Nurse Practitioner

## 2020-09-20 ENCOUNTER — Telehealth (HOSPITAL_COMMUNITY): Payer: Self-pay

## 2020-09-20 ENCOUNTER — Encounter: Payer: Self-pay | Admitting: Nurse Practitioner

## 2020-09-20 DIAGNOSIS — U071 COVID-19: Secondary | ICD-10-CM

## 2020-09-20 DIAGNOSIS — E663 Overweight: Secondary | ICD-10-CM

## 2020-09-20 DIAGNOSIS — I10 Essential (primary) hypertension: Secondary | ICD-10-CM

## 2020-09-20 NOTE — Progress Notes (Signed)
I connected by phone with Jorge Schwartz on 09/20/2020 at 1:21 PM to discuss the potential use of a new treatment for mild to moderate COVID-19 viral infection in non-hospitalized patients.  This patient is a 49 y.o. male that meets the FDA criteria for Emergency Use Authorization of COVID monoclonal antibody casirivimab/imdevimab.  Has a (+) direct SARS-CoV-2 viral test result  Has mild or moderate COVID-19   Is NOT hospitalized due to COVID-19  Is within 10 days of symptom onset  Has at least one of the high risk factor(s) for progression to severe COVID-19 and/or hospitalization as defined in EUA.  Specific high risk criteria : BMI > 25; HTN   I have spoken and communicated the following to the patient or parent/caregiver regarding COVID monoclonal antibody treatment:  1. FDA has authorized the emergency use for the treatment of mild to moderate COVID-19 in adults and pediatric patients with positive results of direct SARS-CoV-2 viral testing who are 11 years of age and older weighing at least 40 kg, and who are at high risk for progressing to severe COVID-19 and/or hospitalization.  2. The significant known and potential risks and benefits of COVID monoclonal antibody, and the extent to which such potential risks and benefits are unknown.  3. Information on available alternative treatments and the risks and benefits of those alternatives, including clinical trials.  4. Patients treated with COVID monoclonal antibody should continue to self-isolate and use infection control measures (e.g., wear mask, isolate, social distance, avoid sharing personal items, clean and disinfect "high touch" surfaces, and frequent handwashing) according to CDC guidelines.   5. The patient or parent/caregiver has the option to accept or refuse COVID monoclonal antibody treatment.  After reviewing this information with the patient, the patient has agreed to receive one of the available covid 19 monoclonal  antibodies and will be provided an appropriate fact sheet prior to infusion.  [name], NP 09/20/2020 1:21 PM

## 2020-09-20 NOTE — Telephone Encounter (Signed)
Called to Discuss with patient about Covid symptoms and the use of the monoclonal antibody infusion for those with mild to moderate Covid symptoms and at a high risk of hospitalization.     Pt appears to qualify for this infusion due to co-morbid conditions and/or a member of an at-risk group in accordance with the FDA Emergency Use Authorization.    Patient interested in treatment. Pre-screened by RN and ready for APP.   Sx onset 10/23; (+) test positive 10/25 via at-home kit; Rick Factor: HTN, ethnic group, BMI over 25.  Sx include fever, congestion, weakness, cough and fatigue.

## 2020-09-20 NOTE — Telephone Encounter (Signed)
Message sent to MAB pool.

## 2020-09-21 ENCOUNTER — Ambulatory Visit (HOSPITAL_COMMUNITY)
Admission: RE | Admit: 2020-09-21 | Discharge: 2020-09-21 | Disposition: A | Discharge status: Home / Self Care | Payer: BC Managed Care – PPO | Source: Ambulatory Visit | Attending: Pulmonary Disease | Admitting: Pulmonary Disease

## 2020-09-21 DIAGNOSIS — U071 COVID-19: Secondary | ICD-10-CM | POA: Insufficient documentation

## 2020-09-21 DIAGNOSIS — I10 Essential (primary) hypertension: Secondary | ICD-10-CM | POA: Diagnosis not present

## 2020-09-21 DIAGNOSIS — E663 Overweight: Secondary | ICD-10-CM | POA: Insufficient documentation

## 2020-09-21 MED ORDER — METHYLPREDNISOLONE SODIUM SUCC 125 MG IJ SOLR: 125.0000 mg | Freq: Once | INTRAMUSCULAR | Status: DC | PRN

## 2020-09-21 MED ORDER — DIPHENHYDRAMINE HCL 50 MG/ML IJ SOLN: 50.0000 mg | Freq: Once | INTRAMUSCULAR | Status: DC | PRN

## 2020-09-21 MED ORDER — ALBUTEROL SULFATE HFA 108 (90 BASE) MCG/ACT IN AERS: 2.0000 | INHALATION_SPRAY | Freq: Once | RESPIRATORY_TRACT | Status: DC | PRN

## 2020-09-21 MED ORDER — EPINEPHRINE 0.3 MG/0.3ML IJ SOAJ: 0.3000 mg | Freq: Once | INTRAMUSCULAR | Status: DC | PRN

## 2020-09-21 MED ORDER — SODIUM CHLORIDE 0.9 % IV SOLN: INTRAVENOUS | Status: DC | PRN

## 2020-09-21 MED ORDER — SODIUM CHLORIDE 0.9 % IV SOLN: Freq: Once | INTRAVENOUS | Status: AC

## 2020-09-21 MED ORDER — FAMOTIDINE IN NACL 20-0.9 MG/50ML-% IV SOLN: 20.0000 mg | Freq: Once | INTRAVENOUS | Status: DC | PRN

## 2020-09-21 NOTE — Progress Notes (Signed)
  Diagnosis: COVID-19  Physician: Dr. Delford Field  Procedure: Cov[facility] Infusion [facility] Med: bamlanivimab\etesevimab infusion - Provided patient with bamlanimivab\etesevimab fact sheet for patients, parents and caregivers prior to infusion.  Complications: No immediate complications noted.  Discharge: Discharged home   [facility] 09/21/2020

## 2020-09-21 NOTE — Discharge Instructions (Signed)

## 2020-10-04 ENCOUNTER — Other Ambulatory Visit: Payer: Self-pay

## 2020-10-04 ENCOUNTER — Encounter: Payer: Self-pay | Admitting: Internal Medicine

## 2020-10-04 ENCOUNTER — Telehealth (INDEPENDENT_AMBULATORY_CARE_PROVIDER_SITE_OTHER): Payer: BC Managed Care – PPO | Admitting: Internal Medicine

## 2020-10-04 VITALS — Ht 69.5 in | Wt 195.0 lb

## 2020-10-04 DIAGNOSIS — U099 Post covid-19 condition, unspecified: Secondary | ICD-10-CM | POA: Diagnosis not present

## 2020-10-04 NOTE — Progress Notes (Signed)
Pre visit review using our clinic review tool, if applicable. No additional management support is needed unless otherwise documented below in the visit note. 

## 2020-10-04 NOTE — Progress Notes (Signed)
Subjective:    Patient ID: Jorge Schwartz, male    DOB: Sep 23, 1971, 49 y.o.   MRN: [id number]  DOS:  10/04/2020 Type of visit - description: Virtual Visit via Video Note  I connected with the above patient  by a video enabled telemedicine application and verified that I am speaking with the correct person using two identifiers.   THIS ENCOUNTER IS A VIRTUAL VISIT DUE TO COVID-19 - PATIENT WAS NOT SEEN IN THE OFFICE. PATIENT HAS CONSENTED TO VIRTUAL VISIT / TELEMEDICINE VISIT   Location of patient: home  Location of provider: office  Persons participating in the virtual visit: patient, provider   I discussed the limitations of evaluation and management by telemedicine and the availability of in person appointments. The patient expressed understanding and agreed to proceed.  Acute Diagnosed with COVID 09/19/2020. At the time he received Zithromax, also  a monoclonal antibody infusion on 09/21/2020. Had fever x3 days, took Tylenol. Since then fever has resolved.  The reason for this call is that he continue with cough and wonders if that is okay. The cough overall has decreased and is treated with over-the-counter medications. He also has fatigue but that is mild and improving.   BP Readings from Last 3 Encounters:  09/21/20 (!) 146/95  09/19/20 130/85  07/15/20 111/67    Review of Systems Denies fever chills since the onset of symptoms No chest pain, difficulty breathing or wheezing. No sputum production Appetite is okay    Past Medical History:  Diagnosis Date  . COVID-19 virus infection 08/2020  . Essential hypertension   . Overweight (BMI 25.0-29.9)     Past Surgical History:  Procedure Laterality Date  . NO PAST SURGERIES      Allergies as of 10/04/2020   No Known Allergies     Medication List       Accurate as of October 04, 2020 11:59 PM. If you have any questions, ask your nurse or doctor.        ALPRAZolam 0.25 MG tablet Commonly known as:  XANAX Take 1 tablet (0.25 mg total) by mouth at bedtime as needed for anxiety. 1-2 PRN   azithromycin 250 MG tablet Commonly known as: ZITHROMAX Take 2 tablets by mouth on day 1, followed by 1 tablet by mouth daily for 4 days.   benzonatate 100 MG capsule Commonly known as: TESSALON Take 1 capsule (100 mg total) by mouth 3 (three) times daily as needed for cough.   cetirizine 10 MG tablet Commonly known as: ZYRTEC Take 10 mg by mouth daily as needed.   fluticasone 50 MCG/ACT nasal spray Commonly known as: FLONASE Place 2 sprays into both nostrils daily.   sertraline 100 MG tablet Commonly known as: ZOLOFT Take 1 tablet (100 mg total) by mouth daily.   telmisartan 40 MG tablet Commonly known as: MICARDIS Take 1 tablet (40 mg total) by mouth daily.          Objective:   Physical Exam Ht 5' 9.5" (1.765 m)   Wt 195 lb (88.5 kg)   BMI 28.38 kg/m  Alert oriented x3, in no distress, speaking in complete sentences, mild cough noted.    Assessment       Assessment Anxiety  PLAN: COVID-19: DX 09/19/2020, treated with Zithromax monoclonal antibody infusion on 09/21/2020. He is concerned because the cough is still persistent and he has mild fatigue. Fortunately the cough is mild, he has no more fever chills, no sputum production. At this point I think  he is doing well, he feels ready to go back to work tomorrow and I agree. He has an appointment with me next month, he will let me know how he is doing but call sooner if needed Had 2 Covid vaccination, recommend a booster in 90 days.   I discussed the assessment and treatment plan with the patient. The patient was provided an opportunity to ask questions and all were answered. The patient agreed with the plan and demonstrated an understanding of the instructions.   The patient was advised to call back or seek an in-person evaluation if the symptoms worsen or if the condition fails to improve as anticipated.

## 2020-10-05 NOTE — Assessment & Plan Note (Signed)
COVID-19: DX 09/19/2020, treated with Zithromax monoclonal antibody infusion on 09/21/2020. He is concerned because the cough is still persistent and he has mild fatigue. Fortunately the cough is mild, he has no more fever chills, no sputum production. At this point I think he is doing well, he feels ready to go back to work tomorrow and I agree. He has an appointment with me next month, he will let me know how he is doing but call sooner if needed Had 2 Covid vaccination, recommend a booster in 90 days.

## 2020-11-16 ENCOUNTER — Encounter: Payer: Self-pay | Admitting: Internal Medicine

## 2020-11-16 ENCOUNTER — Encounter: Payer: BC Managed Care – PPO | Admitting: Internal Medicine

## 2020-11-16 DIAGNOSIS — Z0289 Encounter for other administrative examinations: Secondary | ICD-10-CM

## 2020-12-29 ENCOUNTER — Other Ambulatory Visit: Payer: Self-pay

## 2020-12-29 ENCOUNTER — Ambulatory Visit (INDEPENDENT_AMBULATORY_CARE_PROVIDER_SITE_OTHER): Payer: BC Managed Care – PPO | Admitting: Adult Health

## 2020-12-29 ENCOUNTER — Encounter: Payer: Self-pay | Admitting: Adult Health

## 2020-12-29 DIAGNOSIS — F411 Generalized anxiety disorder: Secondary | ICD-10-CM | POA: Diagnosis not present

## 2020-12-29 DIAGNOSIS — F410 Panic disorder [episodic paroxysmal anxiety] without agoraphobia: Secondary | ICD-10-CM

## 2020-12-29 MED ORDER — ALPRAZOLAM 0.25 MG PO TABS: 0.2500 mg | ORAL_TABLET | Freq: Every evening | ORAL | 2 refills | Status: DC | PRN

## 2020-12-29 MED ORDER — SERTRALINE HCL 100 MG PO TABS: 100.0000 mg | ORAL_TABLET | Freq: Every day | ORAL | 3 refills | Status: DC

## 2020-12-29 NOTE — Progress Notes (Signed)
Jorge Schwartz [id number] 12/12/1970 50 y.o.  Subjective:   Patient ID:  Jorge Schwartz is a 50 y.o. (DOB 1971/07/30) male.  Chief Complaint: No chief complaint on file.   HPI Manley Fason presents to the office today for follow-up of anxiety, fear of lying, and panic attacks.  Describes mood today as "ok". Pleasant. Mood symptoms - denies depression, anxiety, and irritability. Stating "I'm doing well, I'm very fortunate". Family dong well. Stable interest and motivation. Taking medications as prescribed.  Energy levels stable. Active, has a regular exercise routine.   Enjoys some usual interests and activities. Married. Lives with wife of 21 years - has 47-50 year old sons. No family local. Appetite adequate. Weight stable - 193 pounds. Sleeps well most nights. Averages 7 to 8 hours. Focus and concentration stable. Completing tasks. Managing aspects of household. Work going well Control and instrumentation engineer. Denies SI or HI.  Denies AH or VH.  Previous medication trials: Clonazepam  PHQ2-9   Flowsheet Row Office Visit from 07/15/2020 in [facility] Office Visit from 10/15/2018 in [facility] at [facility]  PHQ-2 Total Score 0 0       Review of Systems:  Review of Systems  Musculoskeletal: Negative for gait problem.  Neurological: Negative for tremors.  Psychiatric/Behavioral:       Please refer to HPI    Medications: I have reviewed the patient's current medications.  Current Outpatient Medications  Medication Sig Dispense Refill  . ALPRAZolam (XANAX) 0.25 MG tablet Take 1 tablet (0.25 mg total) by mouth at bedtime as needed for anxiety. 30 tablet 2  . azithromycin (ZITHROMAX) 250 MG tablet Take 2 tablets by mouth on day 1, followed by 1 tablet by mouth daily for 4 days. (Patient not taking: Reported on 10/04/2020) 6 tablet 0  . benzonatate (TESSALON) 100 MG capsule Take 1 capsule (100 mg total) by mouth 3 (three) times  daily as needed for cough. 30 capsule 0  . cetirizine (ZYRTEC) 10 MG tablet Take 10 mg by mouth daily as needed.      . fluticasone (FLONASE) 50 MCG/ACT nasal spray Place 2 sprays into both nostrils daily. 16 g 1  . sertraline (ZOLOFT) 100 MG tablet Take 1 tablet (100 mg total) by mouth daily. 90 tablet 3  . telmisartan (MICARDIS) 40 MG tablet Take 1 tablet (40 mg total) by mouth daily. 90 tablet 0   No current facility-administered medications for this visit.    Medication Side Effects: None  Allergies: No Known Allergies  Past Medical History:  Diagnosis Date  . COVID-19 virus infection 08/2020  . Essential hypertension   . Overweight (BMI 25.0-29.9)     Family History  Problem Relation Age of Onset  . Prostate cancer Other        uncles, cousins  . Sudden death Neg Hx   . Colon cancer Neg Hx   . CAD Neg Hx   . Diabetes Neg Hx     Social History   Socioeconomic History  . Marital status: Married    Spouse name: Not on file  . Number of children: 2  . Years of education: Not on file  . Highest education level: Not on file  Occupational History  . Occupation: [employer] , supply chain  Tobacco Use  . Smoking status: Never Smoker  . Smokeless tobacco: Never Used  Substance and Sexual Activity  . Alcohol use: Yes    Comment: socially   . Drug  use: Not on file  . Sexual activity: Yes  Other Topics Concern  . Not on file  Social History Narrative    Married to Jorge Schwartz with 2 children. Has a masters degree.  He is a Production designer, theatre/television/film for [employer].     2 boys (twins) born 2008   Born in [place]   Social Determinants of Health   Financial Resource Strain: Not on file  Food Insecurity: Not on file  Transportation Needs: Not on file  Physical Activity: Not on file  Stress: Not on file  Social Connections: Not on file  Intimate Partner Violence: Not on file    Past Medical History, Surgical history, Social history, and Family history were reviewed and updated  as appropriate.   Please see review of systems for further details on the patient's review from today.   Objective:   Physical Exam:  There were no vitals taken for this visit.  Physical Exam Constitutional:      General: He is not in acute distress. Musculoskeletal:        General: No deformity.  Neurological:     Mental Status: He is alert and oriented to person, place, and time.     Coordination: Coordination normal.  Psychiatric:        Attention and Perception: Attention and perception normal. He does not perceive auditory or visual hallucinations.        Mood and Affect: Mood normal. Mood is not anxious or depressed. Affect is not labile, blunt, angry or inappropriate.        Speech: Speech normal.        Behavior: Behavior normal.        Thought Content: Thought content normal. Thought content is not paranoid or delusional. Thought content does not include homicidal or suicidal ideation. Thought content does not include homicidal or suicidal plan.        Cognition and Memory: Cognition and memory normal.        Judgment: Judgment normal.     Comments: Insight intact     Lab Review:     Component Value Date/Time   NA 139 07/15/2020 1615   K 4.1 07/15/2020 1615   CL 105 07/15/2020 1615   CO2 26 07/15/2020 1615   GLUCOSE 86 07/15/2020 1615   BUN 16 07/15/2020 1615   CREATININE 1.04 07/15/2020 1615   CALCIUM 9.0 07/15/2020 1615   PROT 6.2 07/15/2020 1615   ALBUMIN 4.5 10/15/2018 1430   AST 14 07/15/2020 1615   ALT 11 07/15/2020 1615   ALKPHOS 55 10/15/2018 1430   BILITOT 0.5 07/15/2020 1615       Component Value Date/Time   WBC 8.3 07/15/2020 1615   RBC 4.78 07/15/2020 1615   HGB 13.8 07/15/2020 1615   HCT 40.7 07/15/2020 1615   PLT 369 07/15/2020 1615   MCV 85.1 07/15/2020 1615   MCH 28.9 07/15/2020 1615   MCHC 33.9 07/15/2020 1615   RDW 13.0 07/15/2020 1615   LYMPHSABS 2,133 07/15/2020 1615   MONOABS 0.5 10/15/2018 1430   EOSABS 216 07/15/2020 1615    BASOSABS 50 07/15/2020 1615    No results found for: POCLITH, LITHIUM   No results found for: PHENYTOIN, PHENOBARB, VALPROATE, CBMZ   .res Assessment: Plan:    Plan:  PDMP reviewed  1. Continue Zoloft 100mg  daily 2. Xanax 0.25mg  daily prn - uses infrequently.   RTC 1 year  Patient advised to contact office with any questions, adverse effects, or acute worsening  in signs and symptoms.  Discussed potential benefits, risk, and side effects of benzodiazepines to include potential risk of tolerance and dependence, as well as possible drowsiness.  Advised patient not to drive if experiencing drowsiness and to take lowest possible effective dose to minimize risk of dependence and tolerance.    Diagnoses and all orders for this visit:  Generalized anxiety disorder -     sertraline (ZOLOFT) 100 MG tablet; Take 1 tablet (100 mg total) by mouth daily. -     ALPRAZolam (XANAX) 0.25 MG tablet; Take 1 tablet (0.25 mg total) by mouth at bedtime as needed for anxiety.  Panic attacks -     sertraline (ZOLOFT) 100 MG tablet; Take 1 tablet (100 mg total) by mouth daily. -     ALPRAZolam (XANAX) 0.25 MG tablet; Take 1 tablet (0.25 mg total) by mouth at bedtime as needed for anxiety.     Please see After Visit Summary for patient specific instructions.  No future appointments.  No orders of the defined types were placed in this encounter.   -------------------------------

## 2021-01-25 ENCOUNTER — Ambulatory Visit: Payer: Self-pay

## 2021-01-25 ENCOUNTER — Encounter: Payer: Self-pay | Admitting: Family Medicine

## 2021-01-25 ENCOUNTER — Ambulatory Visit (INDEPENDENT_AMBULATORY_CARE_PROVIDER_SITE_OTHER): Payer: BC Managed Care – PPO | Admitting: Family Medicine

## 2021-01-25 ENCOUNTER — Other Ambulatory Visit: Payer: Self-pay

## 2021-01-25 VITALS — BP 108/76 | Ht 69.5 in | Wt 195.0 lb

## 2021-01-25 DIAGNOSIS — M25562 Pain in left knee: Secondary | ICD-10-CM

## 2021-01-25 DIAGNOSIS — M23204 Derangement of unspecified medial meniscus due to old tear or injury, left knee: Secondary | ICD-10-CM | POA: Diagnosis not present

## 2021-01-25 MED ORDER — PENNSAID 2 % EX SOLN: 1.0000 "application " | Freq: Two times a day (BID) | CUTANEOUS | 2 refills | Status: DC

## 2021-01-25 MED ORDER — TRIAMCINOLONE ACETONIDE 40 MG/ML IJ SUSP
40.0000 mg | Freq: Once | INTRAMUSCULAR | Status: AC
  Administered 2021-01-25: 40 mg via INTRA_ARTICULAR

## 2021-01-25 NOTE — Progress Notes (Signed)
Jorge Schwartz - 50 y.o. male MRN [id number]  Date of birth: September 07, 1971  SUBJECTIVE:  Including CC & ROS.  No chief complaint on file.   Jorge Schwartz is a 50 y.o. male that is presenting with acute on chronic left knee pain.  Denies any specific injury or inciting event.  He does have limited flexion and extension of the knee.  No previous history of surgery.  Has not tried anything for the knee.   Review of Systems See HPI   HISTORY: Past Medical, Surgical, Social, and Family History Reviewed & Updated per EMR.   Pertinent Historical Findings include:  Past Medical History:  Diagnosis Date  . COVID-19 virus infection 08/2020  . Essential hypertension   . Overweight (BMI 25.0-29.9)     Past Surgical History:  Procedure Laterality Date  . NO PAST SURGERIES      Family History  Problem Relation Age of Onset  . Prostate cancer Other        uncles, cousins  . Sudden death Neg Hx   . Colon cancer Neg Hx   . CAD Neg Hx   . Diabetes Neg Hx     Social History   Socioeconomic History  . Marital status: Married    Spouse name: Not on file  . Number of children: 2  . Years of education: Not on file  . Highest education level: Not on file  Occupational History  . Occupation: [employer] , supply chain  Tobacco Use  . Smoking status: Never Smoker  . Smokeless tobacco: Never Used  Substance and Sexual Activity  . Alcohol use: Yes    Comment: socially   . Drug use: Not on file  . Sexual activity: Yes  Other Topics Concern  . Not on file  Social History Narrative    Married to [employer] with 2 children. Has a masters degree.  He is a Production designer, theatre/television/film for [employer].     2 boys (twins) born 2008   Born in [place]   Social Determinants of Health   Financial Resource Strain: Not on file  Food Insecurity: Not on file  Transportation Needs: Not on file  Physical Activity: Not on file  Stress: Not on file  Social Connections: Not on file  Intimate Partner Violence:  Not on file     PHYSICAL EXAM:  VS: BP 108/76 (BP Location: Left Arm, Patient Position: Sitting, Cuff Size: Large)   Ht 5' 9.5" (1.765 m)   Wt 195 lb (88.5 kg)   BMI 28.38 kg/m  Physical Exam Gen: NAD, alert, cooperative with exam, well-appearing MSK:  Left knee: Mild effusion. Limited flexion and extension. No instability. No tenderness to palpation. Neurovascular intact  Limited ultrasound: Left knee:  Mild to moderate effusion. Normal-appearing quadricep and patellar tendon. Has mild degenerative changes of the joint space and meniscus medially. Has mild degenerative changes of meniscus laterally.  Summary: Effusion with degenerative changes appreciated medially  Ultrasound and interpretation by Clare Gandy, MD   Aspiration/Injection Procedure Note Jorge Schwartz Feb 04, 1971  Procedure: Aspiration and Injection Indications: Left knee pain  Procedure Details Consent: Risks of procedure as well as the alternatives and risks of each were explained to the (patient/caregiver).  Consent for procedure obtained. Time Out: Verified patient identification, verified procedure, site/side was marked, verified correct patient position, special equipment/implants available, medications/allergies/relevent history reviewed, required imaging and test results available.  Performed.  The area was cleaned with iodine and alcohol swabs.    The left knee superior  lateral suprapatellar pouch was injected using 3 cc of 1% lidocaine on a 25-gauge 1-1/2 inch needle.  An 18-gauge 1-1/2 inch needle was used to achieve aspiration.  The syringe was switched and a mixture containing 1 cc's of 40 mg Kenalog and 4 cc's of 0.25% bupivacaine with was injected.  Ultrasound was used. Images were obtained in long views showing the injection.    Amount of Fluid Aspirated: 7mL Character of Fluid: clear and straw colored Fluid was sent for:n/a  A sterile dressing was applied.  Patient did tolerate  procedure well.    ASSESSMENT & PLAN:   Degenerative tear of medial meniscus of left knee Symptoms seem most consistent with degenerative change of the meniscus. -Counseled on home exercise therapy and supportive care. -Pennsaid. -Aspiration injection. -Could consider physical therapy or imaging.

## 2021-01-25 NOTE — Assessment & Plan Note (Signed)
Symptoms seem most consistent with degenerative change of the meniscus. -Counseled on home exercise therapy and supportive care. -Pennsaid. -Aspiration injection. -Could consider physical therapy or imaging.

## 2021-01-25 NOTE — Patient Instructions (Signed)
Nice to meet you Please try the exercises   Please try ice  Please try the rub on medicine if needed Please send me a message in MyChart with any questions or updates.  Please see me back in 4 weeks.   --Dr. Jordan Likes

## 2021-03-08 ENCOUNTER — Ambulatory Visit: Payer: BC Managed Care – PPO | Admitting: Family Medicine

## 2021-03-08 NOTE — Progress Notes (Deleted)
  Jorge Schwartz - 50 y.o. male MRN [id number]  Date of birth: [birth year]  SUBJECTIVE:  Including CC & ROS.  No chief complaint on file.   Jorge Schwartz is a 50 y.o. male that is  ***.  ***   Review of Systems See HPI   HISTORY: Past Medical, Surgical, Social, and Family History Reviewed & Updated per EMR.   Pertinent Historical Findings include:  Past Medical History:  Diagnosis Date  . COVID-19 virus infection 08/2020  . Essential hypertension   . Overweight (BMI 25.0-29.9)     Past Surgical History:  Procedure Laterality Date  . NO PAST SURGERIES      Family History  Problem Relation Age of Onset  . Prostate cancer Other        uncles, cousins  . Sudden death Neg Hx   . Colon cancer Neg Hx   . CAD Neg Hx   . Diabetes Neg Hx     Social History   Socioeconomic History  . Marital status: Married    Spouse name: Not on file  . Number of children: 2  . Years of education: Not on file  . Highest education level: Not on file  Occupational History  . Occupation: [employer] , supply chain  Tobacco Use  . Smoking status: Never Smoker  . Smokeless tobacco: Never Used  Substance and Sexual Activity  . Alcohol use: Yes    Comment: socially   . Drug use: Not on file  . Sexual activity: Yes  Other Topics Concern  . Not on file  Social History Narrative    Married to [employer] with 2 children. Has a masters degree.  He is a Production designer, theatre/television/film for [employer].     2 boys (twins) born 2008   Born in [place]   Social Determinants of Health   Financial Resource Strain: Not on file  Food Insecurity: Not on file  Transportation Needs: Not on file  Physical Activity: Not on file  Stress: Not on file  Social Connections: Not on file  Intimate Partner Violence: Not on file     PHYSICAL EXAM:  VS: There were no vitals taken for this visit. Physical Exam Gen: NAD, alert, cooperative with exam, well-appearing MSK:  ***      ASSESSMENT & PLAN:   No  problem-specific Assessment & Plan notes found for this encounter.

## 2021-03-27 ENCOUNTER — Encounter: Payer: BC Managed Care – PPO | Admitting: Internal Medicine

## 2021-04-06 IMAGING — DX DG CHEST 2V
2 series · 2 of 2 positions shown · non-contrast
Comparison: 03/10/2010

CLINICAL DATA: Chest pain hypertension

EXAM:
CHEST - 2 VIEW

[chest pa]
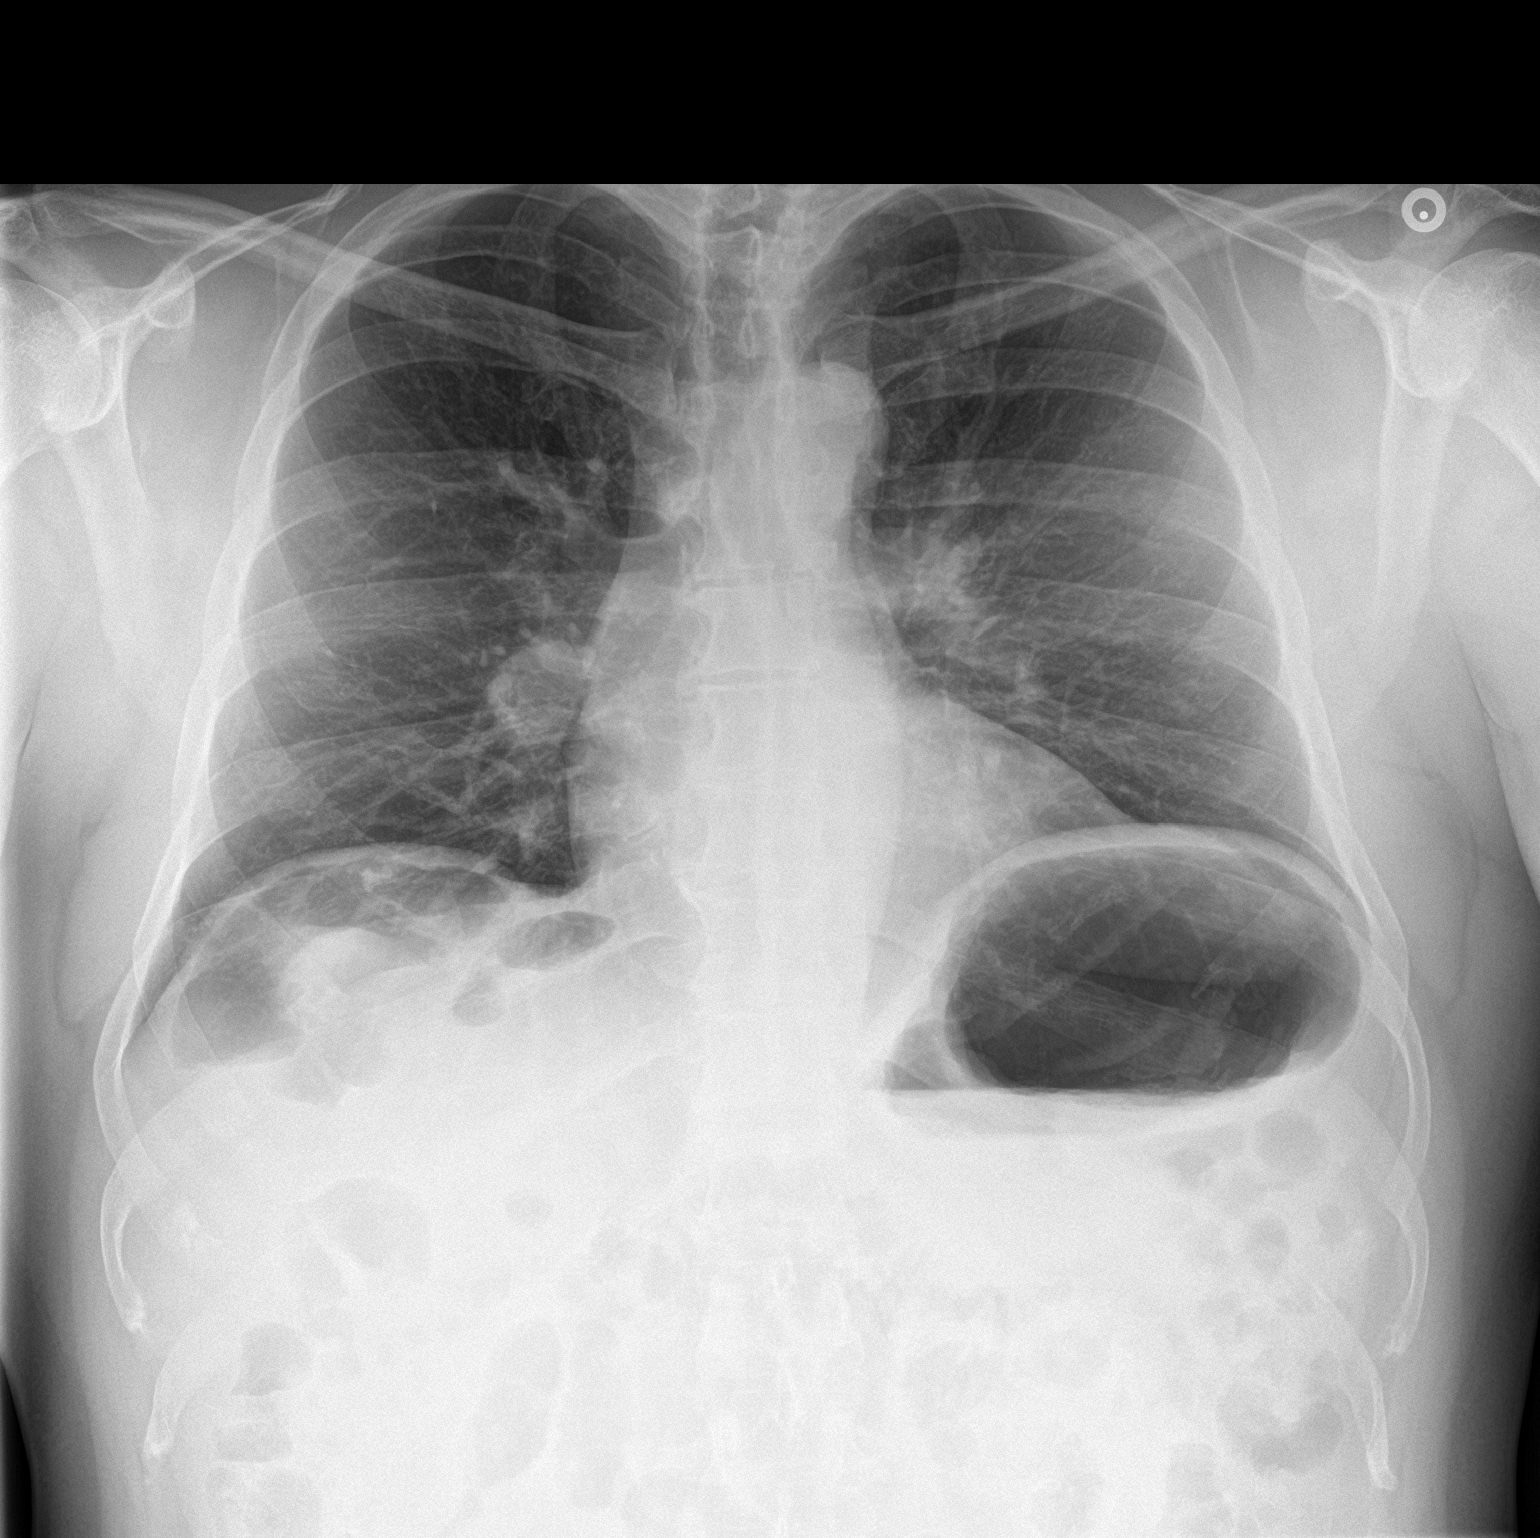

[chest lat]
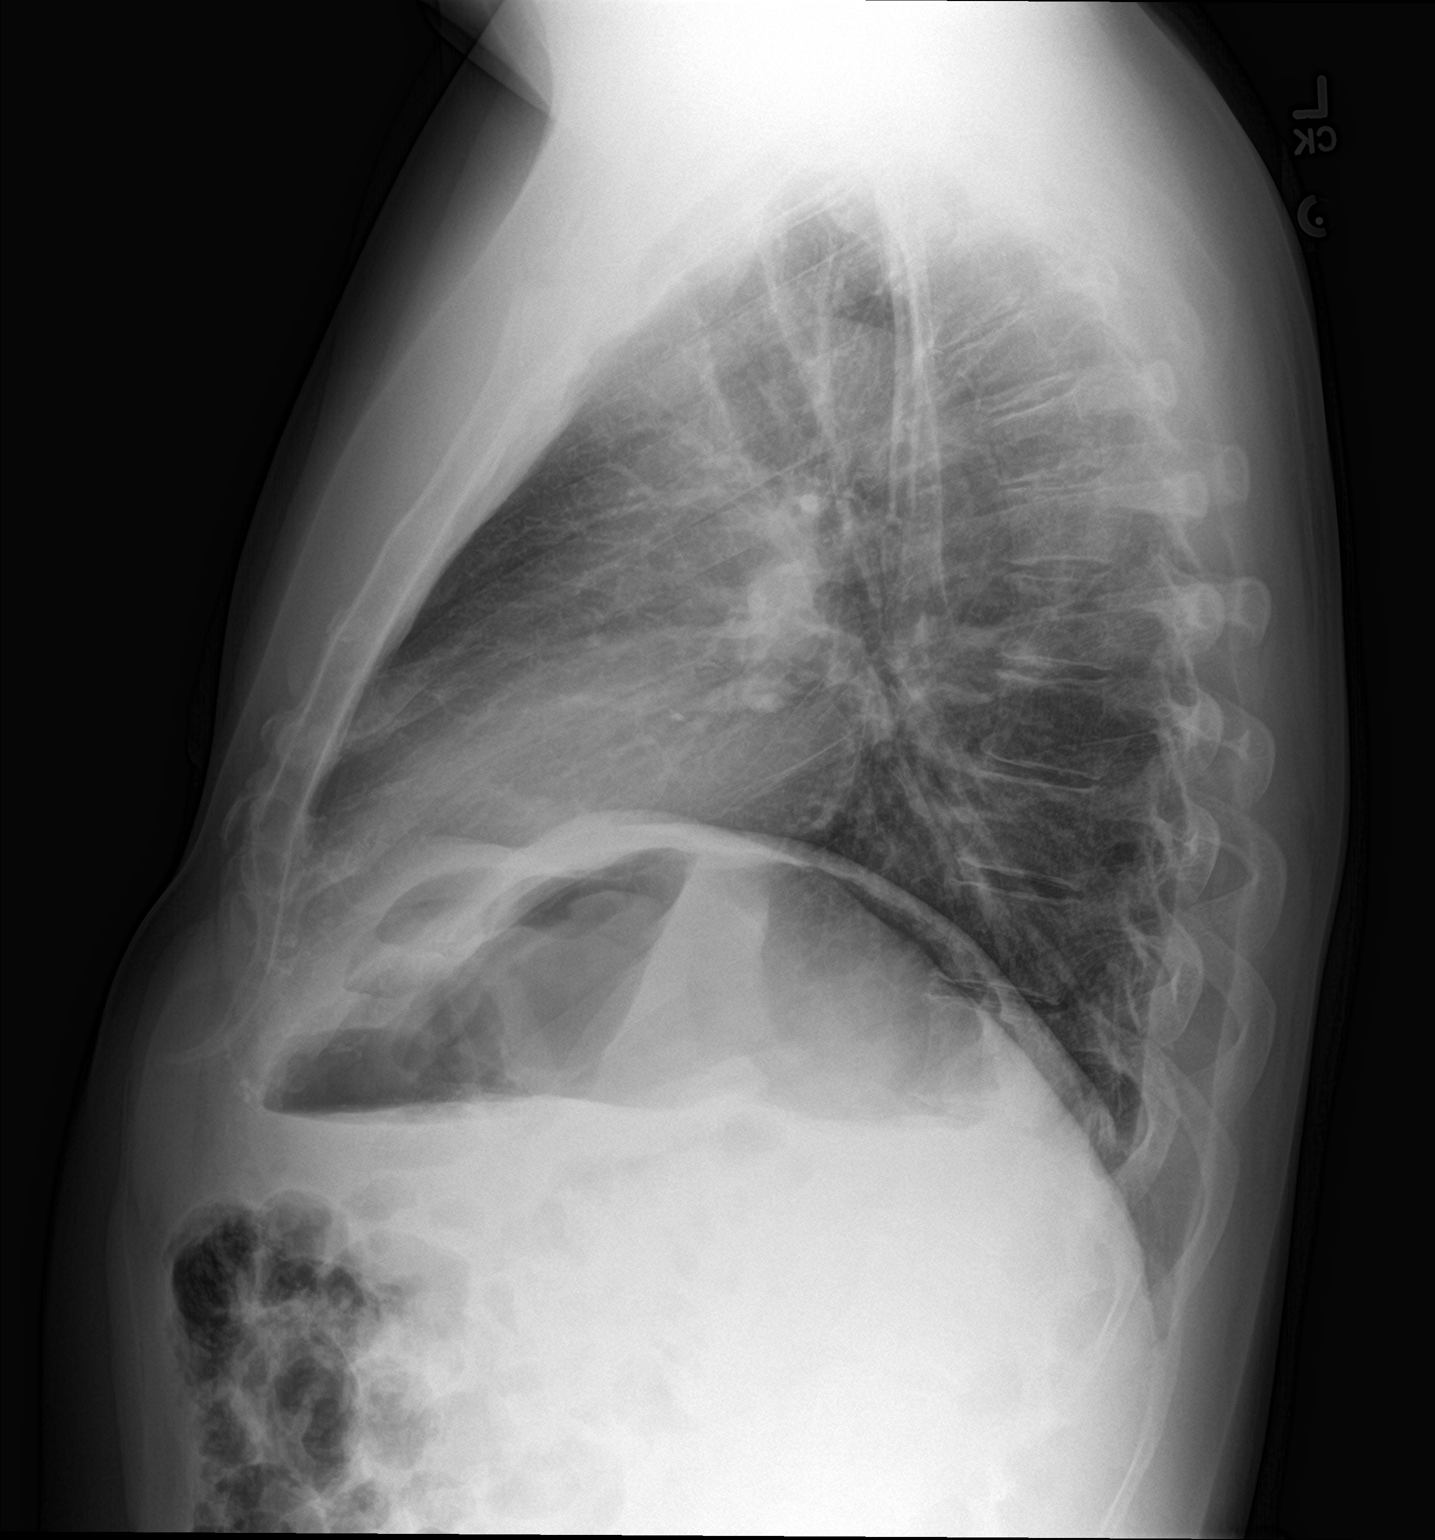

[2 of 2 positions shown; findings below may reference images not displayed]

FINDINGS: No focal opacity or pleural effusion. Stable cardiomediastinal
silhouette. No pneumothorax. Colonic inter positioning beneath the
right diaphragm.
IMPRESSION: No active cardiopulmonary disease.

## 2021-04-25 DIAGNOSIS — M25562 Pain in left knee: Secondary | ICD-10-CM | POA: Diagnosis not present

## 2021-04-25 DIAGNOSIS — S83272D Complex tear of lateral meniscus, current injury, left knee, subsequent encounter: Secondary | ICD-10-CM | POA: Diagnosis not present

## 2021-07-14 DIAGNOSIS — X58XXXA Exposure to other specified factors, initial encounter: Secondary | ICD-10-CM | POA: Diagnosis not present

## 2021-07-14 DIAGNOSIS — M65862 Other synovitis and tenosynovitis, left lower leg: Secondary | ICD-10-CM | POA: Diagnosis not present

## 2021-07-14 DIAGNOSIS — M94262 Chondromalacia, left knee: Secondary | ICD-10-CM | POA: Diagnosis not present

## 2021-07-14 DIAGNOSIS — M25862 Other specified joint disorders, left knee: Secondary | ICD-10-CM | POA: Diagnosis not present

## 2021-07-14 DIAGNOSIS — M1712 Unilateral primary osteoarthritis, left knee: Secondary | ICD-10-CM | POA: Diagnosis not present

## 2021-07-14 DIAGNOSIS — S83242A Other tear of medial meniscus, current injury, left knee, initial encounter: Secondary | ICD-10-CM | POA: Diagnosis not present

## 2021-07-14 DIAGNOSIS — M2342 Loose body in knee, left knee: Secondary | ICD-10-CM | POA: Diagnosis not present

## 2021-07-14 DIAGNOSIS — M2242 Chondromalacia patellae, left knee: Secondary | ICD-10-CM | POA: Diagnosis not present

## 2021-07-14 HISTORY — PX: KNEE ARTHROSCOPY W/ MENISCECTOMY: SHX1879

## 2021-10-09 ENCOUNTER — Ambulatory Visit (INDEPENDENT_AMBULATORY_CARE_PROVIDER_SITE_OTHER): Payer: BC Managed Care – PPO | Admitting: Internal Medicine

## 2021-10-09 ENCOUNTER — Other Ambulatory Visit: Payer: Self-pay

## 2021-10-09 VITALS — BP 145/95 | HR 57 | Temp 98.1°F | Resp 16 | Ht 70.0 in | Wt 204.0 lb

## 2021-10-09 DIAGNOSIS — I10 Essential (primary) hypertension: Secondary | ICD-10-CM

## 2021-10-09 DIAGNOSIS — Z1159 Encounter for screening for other viral diseases: Secondary | ICD-10-CM

## 2021-10-09 DIAGNOSIS — Z0000 Encounter for general adult medical examination without abnormal findings: Secondary | ICD-10-CM

## 2021-10-09 DIAGNOSIS — Z125 Encounter for screening for malignant neoplasm of prostate: Secondary | ICD-10-CM | POA: Diagnosis not present

## 2021-10-09 MED ORDER — AMLODIPINE BESYLATE 5 MG PO TABS: 5.0000 mg | ORAL_TABLET | Freq: Every day | ORAL | 1 refills | Status: DC

## 2021-10-09 NOTE — Assessment & Plan Note (Signed)
Td  2017 Covid vax: bivalent boost rec  Had a flu shot  CCS: Options discussed elected IFob this  year, C-scope next year most likely Prostate ca screening : Distant family members have prostate cancer, DRE negative, check a PSA Labs:CMP, FLP, CBC, A1c, TSH, PSA, hep c

## 2021-10-09 NOTE — Patient Instructions (Addendum)
Recommend amlodipine 5 mg 1 tablet at bedtime.  Low-salt diet  Check the  blood pressure  weekly BP GOAL is between 110/65 and  135/85. If it is consistently higher or lower, let me know    GO TO THE LAB : Get the blood work     GO TO THE FRONT DESK, PLEASE SCHEDULE YOUR APPOINTMENTS Come back for a checkup in 5 months

## 2021-10-09 NOTE — Assessment & Plan Note (Signed)
Assessment Anxiety HTN urgency dx in [place] 06-2020  PLAN: Here for CPX HTN: Had a hypertensive urgency 06-2020, took medications for a while, then stop.  No recent ambulatory BPs.  BP upon arrival 128/96.  I rechecked it manually 145/95. Recommend amlodipine 5 mg, ambulatory BPs and come back in 5 months.  Risk of elevated BP discussed. Anxiety: On sertraline, control it, uses Xanax rarely Meniscal surgery L: Had a arthroscopy few weeks ago, doing better. RTC 5 months

## 2021-10-09 NOTE — Progress Notes (Signed)
Subjective:    Patient ID: Jorge Schwartz, male    DOB: 13-Oct-1971, 50 y.o.   MRN: [id number]  DOS:  10/09/2021 Type of visit - description: cpx  Feels well, has no concerns except his weight.   Review of Systems  Other than above, a 14 point review of systems is negative     Past Medical History:  Diagnosis Date   COVID-19 virus infection 08/2020   Essential hypertension    Overweight (BMI 25.0-29.9)     Past Surgical History:  Procedure Laterality Date   KNEE ARTHROSCOPY W/ MENISCECTOMY Left 07/14/2021   Social History   Socioeconomic History   Marital status: Married    Spouse name: Not on file   Number of children: 2   Years of education: Not on file   Highest education level: Not on file  Occupational History   Occupation: [employer] , supply chain  Tobacco Use   Smoking status: Never   Smokeless tobacco: Never  Substance and Sexual Activity   Alcohol use: Yes    Comment: socially    Drug use: Not on file   Sexual activity: Yes  Other Topics Concern   Not on file  Social History Narrative    Married to [name] with 2 children. Has a masters degree.  He is a Production designer, theatre/television/film for [employer].     2 boys (twins) born 2008   Born in [place]   Social Determinants of Health   Financial Resource Strain: Not on file  Food Insecurity: Not on file  Transportation Needs: Not on file  Physical Activity: Not on file  Stress: Not on file  Social Connections: Not on file  Intimate Partner Violence: Not on file    Allergies as of 10/09/2021   No Known Allergies      Medication List        Accurate as of October 09, 2021  9:08 PM. If you have any questions, ask your nurse or doctor.          STOP taking these medications    azithromycin 250 MG tablet Commonly known as: ZITHROMAX Stopped by: Willow Ora, MD   benzonatate 100 MG capsule Commonly known as: TESSALON Stopped by: Willow Ora, MD   fluticasone 50 MCG/ACT nasal spray Commonly known  as: FLONASE Stopped by: Willow Ora, MD   telmisartan 40 MG tablet Commonly known as: MICARDIS Stopped by: Willow Ora, MD       TAKE these medications    ALPRAZolam 0.25 MG tablet Commonly known as: XANAX Take 1 tablet (0.25 mg total) by mouth at bedtime as needed for anxiety.   amLODipine 5 MG tablet Commonly known as: NORVASC Take 1 tablet (5 mg total) by mouth daily. Started by: Willow Ora, MD   cetirizine 10 MG tablet Commonly known as: ZYRTEC Take 10 mg by mouth daily as needed.   Pennsaid 2 % Soln Generic drug: Diclofenac Sodium Place 1 application onto the skin 2 (two) times daily.   sertraline 100 MG tablet Commonly known as: ZOLOFT Take 1 tablet (100 mg total) by mouth daily.           Objective:   Physical Exam BP (!) 145/95   Pulse (!) 57   Temp 98.1 F (36.7 C) (Oral)   Resp 16   Ht 5\' 10"  (1.778 m)   Wt 204 lb (92.5 kg)   SpO2 98%   BMI 29.27 kg/m  General: Well developed, NAD, BMI noted Neck: No  thyromegaly  HEENT:  Normocephalic . Face symmetric, atraumatic Lungs:  CTA B Normal respiratory effort, no intercostal retractions, no accessory muscle use. Heart: RRR,  no murmur.  Abdomen:  Not distended, soft, non-tender. No rebound or rigidity. DRE: Exam is limited but proximal prostate seems normal.  Brown stools Lower extremities: no pretibial edema bilaterally  Skin: Exposed areas without rash. Not pale. Not jaundice Neurologic:  alert & oriented X3.  Speech normal, gait appropriate for age and unassisted Strength symmetric and appropriate for age.  Psych: Cognition and judgment appear intact.  Cooperative with normal attention span and concentration.  Behavior appropriate. No anxious or depressed appearing.     Assessment    Assessment Anxiety HTN urgency dx in [place] 06-2020  PLAN: Here for CPX HTN: Had a hypertensive urgency 06-2020, took medications for a while, then stop.  No recent ambulatory BPs.  BP upon arrival 128/96.   I rechecked it manually 145/95. Recommend amlodipine 5 mg, ambulatory BPs and come back in 5 months.  Risk of elevated BP discussed. Anxiety: On sertraline, control it, uses Xanax rarely Meniscal surgery L: Had a arthroscopy few weeks ago, doing better. RTC 5 months    This visit occurred during the SARS-CoV-2 public health emergency.  Safety protocols were in place, including screening questions prior to the visit, additional usage of staff PPE, and extensive cleaning of exam room while observing appropriate contact time as indicated for disinfecting solutions.

## 2021-10-10 LAB — HEPATITIS C ANTIBODY
Hepatitis C Ab: NONREACTIVE
SIGNAL TO CUT-OFF: 0.03 (ref <1.00)

## 2021-10-10 LAB — PSA: PSA: 0.59 ng/mL (ref 0.10–4.00)

## 2021-10-10 LAB — COMPREHENSIVE METABOLIC PANEL
ALT: 13 U/L (ref 0–53)
AST: 13 U/L (ref 0–37)
Albumin: 4.4 g/dL (ref 3.5–5.2)
Alkaline Phosphatase: 56 U/L (ref 39–117)
BUN: 12 mg/dL (ref 6–23)
CO2: 30 mEq/L (ref 19–32)
Calcium: 9.3 mg/dL (ref 8.4–10.5)
Chloride: 103 mEq/L (ref 96–112)
Creatinine, Ser: 0.89 mg/dL (ref 0.40–1.50)
GFR: 100.29 mL/min (ref >60.00)
Glucose, Bld: 85 mg/dL (ref 70–99)
Potassium: 4.7 mEq/L (ref 3.5–5.1)
Sodium: 139 mEq/L (ref 135–145)
Total Bilirubin: 0.9 mg/dL (ref 0.2–1.2)
Total Protein: 6.8 g/dL (ref 6.0–8.3)

## 2021-10-10 LAB — CBC WITH DIFFERENTIAL/PLATELET
Basophils Absolute: 0 10*3/uL (ref 0.0–0.1)
Basophils Relative: 0.7 % (ref 0.0–3.0)
Eosinophils Absolute: 0.2 10*3/uL (ref 0.0–0.7)
Eosinophils Relative: 4.1 % (ref 0.0–5.0)
HCT: 39 % (ref 39.0–52.0)
Hemoglobin: 13.2 g/dL (ref 13.0–17.0)
Lymphocytes Relative: 34.4 % (ref 12.0–46.0)
Lymphs Abs: 1.9 10*3/uL (ref 0.7–4.0)
MCHC: 33.8 g/dL (ref 30.0–36.0)
MCV: 87.2 fl (ref 78.0–100.0)
Monocytes Absolute: 0.4 10*3/uL (ref 0.1–1.0)
Monocytes Relative: 6.3 % (ref 3.0–12.0)
Neutro Abs: 3 10*3/uL (ref 1.4–7.7)
Neutrophils Relative %: 54.5 % (ref 43.0–77.0)
Platelets: 328 10*3/uL (ref 150.0–400.0)
RBC: 4.47 Mil/uL (ref 4.22–5.81)
RDW: 13.3 % (ref 11.5–15.5)
WBC: 5.6 10*3/uL (ref 4.0–10.5)

## 2021-10-10 LAB — HEMOGLOBIN A1C: Hgb A1c MFr Bld: 6.1 % (ref 4.6–6.5)

## 2021-10-10 LAB — LIPID PANEL
Cholesterol: 168 mg/dL (ref 0–200)
HDL: 46.6 mg/dL (ref >39.00)
LDL Cholesterol: 88 mg/dL (ref 0–99)
NonHDL: 121.34
Total CHOL/HDL Ratio: 4
Triglycerides: 165 mg/dL — ABNORMAL HIGH (ref 0.0–149.0)
VLDL: 33 mg/dL (ref 0.0–40.0)

## 2021-10-10 LAB — TSH: TSH: 2.08 u[IU]/mL (ref 0.35–5.50)

## 2021-11-28 DIAGNOSIS — S86912A Strain of unspecified muscle(s) and tendon(s) at lower leg level, left leg, initial encounter: Secondary | ICD-10-CM | POA: Diagnosis not present

## 2021-11-28 DIAGNOSIS — Y9323 Activity, snow (alpine) (downhill) skiing, snow boarding, sledding, tobogganing and snow tubing: Secondary | ICD-10-CM | POA: Diagnosis not present

## 2021-11-28 DIAGNOSIS — S8992XA Unspecified injury of left lower leg, initial encounter: Secondary | ICD-10-CM | POA: Diagnosis not present

## 2021-11-28 DIAGNOSIS — M25562 Pain in left knee: Secondary | ICD-10-CM | POA: Diagnosis not present

## 2021-12-29 ENCOUNTER — Encounter: Payer: Self-pay | Admitting: Adult Health

## 2021-12-29 ENCOUNTER — Telehealth (INDEPENDENT_AMBULATORY_CARE_PROVIDER_SITE_OTHER): Payer: BC Managed Care – PPO | Admitting: Adult Health

## 2021-12-29 DIAGNOSIS — F40243 Fear of flying: Secondary | ICD-10-CM | POA: Diagnosis not present

## 2021-12-29 DIAGNOSIS — F411 Generalized anxiety disorder: Secondary | ICD-10-CM

## 2021-12-29 DIAGNOSIS — F410 Panic disorder [episodic paroxysmal anxiety] without agoraphobia: Secondary | ICD-10-CM

## 2021-12-29 NOTE — Progress Notes (Signed)
Jorge Schwartz [id number] [birth year] 51 y.o.  Virtual Visit via Video Note  I connected with pt @ on 12/29/21 at  8:00 AM EST by a video enabled telemedicine application and verified that I am speaking with the correct person using two identifiers.   I discussed the limitations of evaluation and management by telemedicine and the availability of in person appointments. The patient expressed understanding and agreed to proceed.  I discussed the assessment and treatment plan with the patient. The patient was provided an opportunity to ask questions and all were answered. The patient agreed with the plan and demonstrated an understanding of the instructions.   The patient was advised to call back or seek an in-person evaluation if the symptoms worsen or if the condition fails to improve as anticipated.  I provided 10 minutes of non-face-to-face time during this encounter.  The patient was located at home.  The provider was located at [place].   Jorge Gell, NP   Subjective:   Patient ID:  Jorge Schwartz is a 51 y.o. (DOB Sep 30, 1971) male.  Chief Complaint: No chief complaint on file.   HPI Jorge Schwartz presents for follow-up of anxiety, fear of flying, and panic attacks.  Describes mood today as "ok". Pleasant. Mood symptoms - denies depression and irritability. Feels anxious at times. Using Xanax when flying. Stating "I'm doing well in general". Started on BP medication. Family dong well. Stable interest and motivation. Taking medications as prescribed.  Energy levels stable. Active, has a regular exercise routine.  Tennis. Enjoys some usual interests and activities. Married. Lives with wife of has 24-29 year old sons. No family local. Appetite adequate - [employer] diet. Weight gain - 200 pounds. Recovering from knee surgery. Sleeps well most nights. Averages 7 to 8 hours. Focus and concentration stable. Completing tasks. Managing aspects of household. Work going well  Psychologist, educational. Denies SI or HI.  Denies AH or VH.  Previous medication trials: Clonazepam   Review of Systems:  Review of Systems  Musculoskeletal:  Negative for gait problem.  Neurological:  Negative for tremors.  Psychiatric/Behavioral:         Please refer to HPI   Medications: I have reviewed the patient's current medications.  Current Outpatient Medications  Medication Sig Dispense Refill   ALPRAZolam (XANAX) 0.25 MG tablet Take 1 tablet (0.25 mg total) by mouth at bedtime as needed for anxiety. 30 tablet 2   amLODipine (NORVASC) 5 MG tablet Take 1 tablet (5 mg total) by mouth daily. 90 tablet 1   cetirizine (ZYRTEC) 10 MG tablet Take 10 mg by mouth daily as needed.       Diclofenac Sodium (PENNSAID) 2 % SOLN Place 1 application onto the skin 2 (two) times daily. (Patient not taking: Reported on 10/09/2021) 112 g 2   sertraline (ZOLOFT) 100 MG tablet Take 1 tablet (100 mg total) by mouth daily. 90 tablet 3   No current facility-administered medications for this visit.    Medication Side Effects: None  Allergies: No Known Allergies  Past Medical History:  Diagnosis Date   COVID-19 virus infection 08/2020   Essential hypertension    Overweight (BMI 25.0-29.9)     Family History  Problem Relation Age of Onset   Prostate cancer Other        uncles, cousins   Sudden death Neg Hx    Colon cancer Neg Hx    CAD Neg Hx    Diabetes Neg Hx     Social History  Socioeconomic History   Marital status: Married    Spouse name: Not on file   Number of children: 2   Years of education: Not on file   Highest education level: Not on file  Occupational History   Occupation: [employer] , supply chain  Tobacco Use   Smoking status: Never   Smokeless tobacco: Never  Substance and Sexual Activity   Alcohol use: Yes    Comment: socially    Drug use: Not on file   Sexual activity: Yes  Other Topics Concern   Not on file  Social History Narrative    Married to [place] with  2 children. Has a masters degree.  He is a Production designer, theatre/television/film for [employer].     2 boys (twins) born 2008   Born in [place]   Social Determinants of Health   Financial Resource Strain: Not on file  Food Insecurity: Not on file  Transportation Needs: Not on file  Physical Activity: Not on file  Stress: Not on file  Social Connections: Not on file  Intimate Partner Violence: Not on file    Past Medical History, Surgical history, Social history, and Family history were reviewed and updated as appropriate.   Please see review of systems for further details on the patient's review from today.   Objective:   Physical Exam:  There were no vitals taken for this visit.  Physical Exam Constitutional:      General: He is not in acute distress. Musculoskeletal:        General: No deformity.  Neurological:     Mental Status: He is alert and oriented to person, place, and time.     Coordination: Coordination normal.  Psychiatric:        Attention and Perception: Attention and perception normal. He does not perceive auditory or visual hallucinations.        Mood and Affect: Mood normal. Mood is not anxious or depressed. Affect is not labile, blunt, angry or inappropriate.        Speech: Speech normal.        Behavior: Behavior normal.        Thought Content: Thought content normal. Thought content is not paranoid or delusional. Thought content does not include homicidal or suicidal ideation. Thought content does not include homicidal or suicidal plan.        Cognition and Memory: Cognition and memory normal.        Judgment: Judgment normal.     Comments: Insight intact    Lab Review:     Component Value Date/Time   NA 139 10/09/2021 1357   K 4.7 10/09/2021 1357   CL 103 10/09/2021 1357   CO2 30 10/09/2021 1357   GLUCOSE 85 10/09/2021 1357   BUN 12 10/09/2021 1357   CREATININE 0.89 10/09/2021 1357   CREATININE 1.04 07/15/2020 1615   CALCIUM 9.3 10/09/2021 1357   PROT 6.8  10/09/2021 1357   ALBUMIN 4.4 10/09/2021 1357   AST 13 10/09/2021 1357   ALT 13 10/09/2021 1357   ALKPHOS 56 10/09/2021 1357   BILITOT 0.9 10/09/2021 1357       Component Value Date/Time   WBC 5.6 10/09/2021 1357   RBC 4.47 10/09/2021 1357   HGB 13.2 10/09/2021 1357   HCT 39.0 10/09/2021 1357   PLT 328.0 10/09/2021 1357   MCV 87.2 10/09/2021 1357   MCH 28.9 07/15/2020 1615   MCHC 33.8 10/09/2021 1357   RDW 13.3 10/09/2021 1357   LYMPHSABS 1.9  10/09/2021 1357   MONOABS 0.4 10/09/2021 1357   EOSABS 0.2 10/09/2021 1357   BASOSABS 0.0 10/09/2021 1357    No results found for: POCLITH, LITHIUM   No results found for: PHENYTOIN, PHENOBARB, VALPROATE, CBMZ   .res Assessment: Plan:    Plan:  PDMP reviewed  1. Continue Zoloft 100mg  daily 2. Xanax 0.25mg  daily prn - uses infrequently.   RTC 1 year  Patient advised to contact office with any questions, adverse effects, or acute worsening in signs and symptoms.  Discussed potential benefits, risk, and side effects of benzodiazepines to include potential risk of tolerance and dependence, as well as possible drowsiness.  Advised patient not to drive if experiencing drowsiness and to take lowest possible effective dose to minimize risk of dependence and tolerance. Diagnoses and all orders for this visit:  Generalized anxiety disorder  Panic attacks  Fear of flying     Please see After Visit Summary for patient specific instructions.  Future Appointments  Date Time Provider [place]  03/12/2022  1:00 PM Colon Branch, MD [facility]    No orders of the defined types were placed in this encounter.     -------------------------------

## 2022-01-18 ENCOUNTER — Other Ambulatory Visit: Payer: Self-pay | Admitting: Adult Health

## 2022-01-18 DIAGNOSIS — F410 Panic disorder [episodic paroxysmal anxiety] without agoraphobia: Secondary | ICD-10-CM

## 2022-01-18 DIAGNOSIS — F411 Generalized anxiety disorder: Secondary | ICD-10-CM

## 2022-01-22 ENCOUNTER — Ambulatory Visit (INDEPENDENT_AMBULATORY_CARE_PROVIDER_SITE_OTHER): Payer: BC Managed Care – PPO | Admitting: Family Medicine

## 2022-01-22 ENCOUNTER — Encounter: Payer: Self-pay | Admitting: Family Medicine

## 2022-01-22 VITALS — BP 108/70 | HR 65 | Temp 98.4°F | Ht 69.5 in | Wt 194.0 lb

## 2022-01-22 DIAGNOSIS — J301 Allergic rhinitis due to pollen: Secondary | ICD-10-CM | POA: Diagnosis not present

## 2022-01-22 LAB — POC COVID19 BINAXNOW: SARS Coronavirus 2 Ag: NEGATIVE

## 2022-01-22 LAB — POCT INFLUENZA A/B
Influenza A, POC: NEGATIVE
Influenza B, POC: NEGATIVE

## 2022-01-22 MED ORDER — PREDNISONE 20 MG PO TABS: 40.0000 mg | ORAL_TABLET | Freq: Every day | ORAL | 0 refills | Status: AC

## 2022-01-22 NOTE — Patient Instructions (Addendum)
Continue to push fluids, practice good hand hygiene, and cover your mouth if you cough. ? ?If you start having fevers, shaking or shortness of breath, seek immediate care. ? ?OK to take Tylenol 1000 mg (2 extra strength tabs) or 975 mg (3 regular strength tabs) every 6 hours as needed. ? ?Let us know if you need anything. ?

## 2022-01-22 NOTE — Progress Notes (Signed)
Chief Complaint  Patient presents with   Generalized Body Aches    Sneezing tired    Neta Mends here for URI complaints.  Duration: 1 week  Associated symptoms: sinus pain, myalgia, and sneezing, fatigue Denies: sinus congestion, sinus pain, itchy watery eyes, ear pain, ear drainage, sore throat, wheezing, shortness of breath, and fevers, coughing Treatment to date: INCS which helped, Zyrtec, Mucinex, Tylenol Sick contacts: No  Past Medical History:  Diagnosis Date   COVID-19 virus infection 08/2020   Essential hypertension    Overweight (BMI 25.0-29.9)     Objective BP 108/70    Pulse 65    Temp 98.4 F (36.9 C) (Oral)    Ht 5' 9.5" (1.765 m)    Wt 194 lb (88 kg)    SpO2 99%    BMI 28.24 kg/m  General: Awake, alert, appears stated age HEENT: AT, [place], ears patent b/l and TM's neg, nares patent w/o discharge, pharynx pink and without exudates, MMM, no ttp over sinuses Neck: No masses or asymmetry Heart: RRR Lungs: CTAB, no accessory muscle use Psych: Age appropriate judgment and insight, normal mood and affect  Seasonal allergic rhinitis due to pollen - Plan: predniSONE (DELTASONE) 20 MG tablet, POCT Influenza A/B, POC COVID-19  5 d pred burst, 40 mg/d. Could be getting over viral illness as he was recently in [place] for a basketball tournament. Covid and flu tests neg.  Continue to push fluids, practice good hand hygiene, cover mouth when coughing. F/u prn. If starting to experience fevers, shaking, or shortness of breath, seek immediate care. Pt voiced understanding and agreement to the plan.  Jilda Roche [place], DO 01/22/22 3:19 PM

## 2022-02-05 ENCOUNTER — Encounter: Payer: Self-pay | Admitting: Internal Medicine

## 2022-02-05 ENCOUNTER — Ambulatory Visit (INDEPENDENT_AMBULATORY_CARE_PROVIDER_SITE_OTHER): Payer: BC Managed Care – PPO | Admitting: Internal Medicine

## 2022-02-05 VITALS — BP 125/80 | HR 58 | Temp 97.9°F | Resp 16 | Ht 69.5 in | Wt 195.5 lb

## 2022-02-05 DIAGNOSIS — I10 Essential (primary) hypertension: Secondary | ICD-10-CM

## 2022-02-05 DIAGNOSIS — Z0189 Encounter for other specified special examinations: Secondary | ICD-10-CM

## 2022-02-05 DIAGNOSIS — R6882 Decreased libido: Secondary | ICD-10-CM | POA: Diagnosis not present

## 2022-02-05 DIAGNOSIS — M545 Low back pain, unspecified: Secondary | ICD-10-CM

## 2022-02-05 DIAGNOSIS — R5383 Other fatigue: Secondary | ICD-10-CM

## 2022-02-05 NOTE — Patient Instructions (Signed)
Check the  blood pressure   ?Be sure your blood pressure is between 110/65 and  145/85. ? if it is consistently higher or lower, let me know ? ?To find a good quality blood pressure cuff: ? ?[employer] ? ? ? ?GO TO THE FRONT DESK, PLEASE SCHEDULE YOUR APPOINTMENTS ?Come back for   blood work in a morning  ? ?Come back for  a visit in 4 months  ?

## 2022-02-05 NOTE — Progress Notes (Unsigned)
Subjective:    Patient ID: Jorge Schwartz, male    DOB: [birth year], 51 y.o.   MRN: [id number]  DOS:  02/05/2022 Type of visit - description: Acute  Several concerns For a while, particularly last 5 weeks has noted decreasing energy and feeling less sleepy throughout the day.  Also decreased libido. He did gain weight before Christmas 2022 but after that he started a diet and in the last 2-1/2 months has lost approximately 15 pounds. Denies snoring  He takes chronic sertraline, emotionally doing well.  Denies any particular distress.  BP is elevated today, normal ambulatory BPs when checked which is not frequent.  Also L back pain, occasional radiation to the buttock down the posterior thigh, changing with certain positions.  Stretching helps to some degree.   Review of Systems See above   Past Medical History:  Diagnosis Date   COVID-19 virus infection 08/2020   Essential hypertension    Overweight (BMI 25.0-29.9)     Past Surgical History:  Procedure Laterality Date   KNEE ARTHROSCOPY W/ MENISCECTOMY Left 07/14/2021    Current Outpatient Medications  Medication Instructions   ALPRAZolam (XANAX) 0.25 mg, Oral, At bedtime PRN   amLODipine (NORVASC) 5 mg, Oral, Daily   cetirizine (ZYRTEC) 10 mg, Oral, Daily PRN   Diclofenac Sodium (PENNSAID) 2 % SOLN 1 application., Transdermal, 2 times daily   sertraline (ZOLOFT) 100 MG tablet TAKE 1 TABLET BY MOUTH EVERY DAY       Objective:   Physical Exam BP (!) 144/100 (BP Location: Left Arm, Patient Position: Sitting, Cuff Size: Small)    Pulse (!) 58    Temp 97.9 F (36.6 C) (Oral)    Resp 16    Ht 5' 9.5" (1.765 m)    Wt 195 lb 8 oz (88.7 kg)    SpO2 99%    BMI 28.46 kg/m  General:   Well developed, NAD, BMI noted. HEENT:  Normocephalic . Face symmetric, atraumatic Lungs:  CTA B Normal respiratory effort, no intercostal retractions, no accessory muscle use. Heart: RRR,  no murmur. GU: Normal testicular size Lower  extremities: no pretibial edema bilaterally  Skin: Not pale. Not jaundice Neurologic:  alert & oriented X3.  Speech normal, gait appropriate for age and unassisted.  DTRs symmetric, straight leg test negative Psych--  Cognition and judgment appear intact.  Cooperative with normal attention span and concentration.  Behavior appropriate. No anxious or depressed appearing.      Assessment     Assessment Anxiety HTN urgency dx in [place] 06-2020  PLAN: Fatigue, decreased libido: Symptoms started a while back, worse in the last 5 weeks.  He did gain weight last year but weight is coming down.  No snoring. Epworth scale: 11, + screening for OSA. Symptoms could be multifactorial, plan: Neuro referral, rule out OSA.  Check testosterone/A1c. Reassess in 4 months HTN: Upon arrival BP elevated, I rechecked left arm: 125/80, recommend to get a good-quality cuff and check regularly.  See AVS Back pain: Left-sided mostly, going on for a while, occasional radiation to the left leg.  No paresthesias.  Neurological exam is benign.  Recommend to start with physical therapy. RTC 4 months  Here for CPX HTN: Had a hypertensive urgency 06-2020, took medications for a while, then stop.  No recent ambulatory BPs.  BP upon arrival 128/96.  I rechecked it manually 145/95. Recommend amlodipine 5 mg, ambulatory BPs and come back in 5 months.  Risk of elevated BP discussed. Anxiety: On sertraline,  control it, uses Xanax rarely Meniscal surgery L: Had a arthroscopy few weeks ago, doing better. RTC 5 months      This visit occurred during the SARS-CoV-2 public health emergency.  Safety protocols were in place, including screening questions prior to the visit, additional usage of staff PPE, and extensive cleaning of exam room while observing appropriate contact time as indicated for disinfecting solutions.

## 2022-02-06 NOTE — Assessment & Plan Note (Signed)
Fatigue, decreased libido: ?Symptoms started a while back, worse in the last 5 weeks.  He did gain weight last year but weight is coming down.  No snoring. Epworth scale: 11, + screening for OSA noting that sxs could be multifactorial, plan: ?Plan: Neuro referral, rule out OSA.  Check testosterone/A1c. ?Reassess in 4 months ?HTN: Upon arrival BP elevated, I rechecked left arm: 125/80, recommend to get a good-quality cuff and check regularly.  See AVS ?Back pain: Left-sided mostly, going on for a while, occasional radiation to the left leg.  No paresthesias.  Neurological exam is benign.  Recommend to start with physical therapy.  Referral sent ?RTC 4 months ?

## 2022-02-12 ENCOUNTER — Other Ambulatory Visit (INDEPENDENT_AMBULATORY_CARE_PROVIDER_SITE_OTHER): Payer: BC Managed Care – PPO

## 2022-02-12 DIAGNOSIS — R5383 Other fatigue: Secondary | ICD-10-CM | POA: Diagnosis not present

## 2022-02-12 DIAGNOSIS — I10 Essential (primary) hypertension: Secondary | ICD-10-CM

## 2022-02-12 DIAGNOSIS — R6882 Decreased libido: Secondary | ICD-10-CM | POA: Diagnosis not present

## 2022-02-12 LAB — HEMOGLOBIN A1C: Hgb A1c MFr Bld: 5.8 % (ref 4.6–6.5)

## 2022-02-12 LAB — TESTOSTERONE: Testosterone: 366.45 ng/dL (ref 300.00–890.00)

## 2022-02-19 DIAGNOSIS — M5459 Other low back pain: Secondary | ICD-10-CM | POA: Diagnosis not present

## 2022-02-19 DIAGNOSIS — M5442 Lumbago with sciatica, left side: Secondary | ICD-10-CM | POA: Diagnosis not present

## 2022-02-21 ENCOUNTER — Telehealth: Payer: Self-pay

## 2022-02-21 DIAGNOSIS — M5442 Lumbago with sciatica, left side: Secondary | ICD-10-CM | POA: Diagnosis not present

## 2022-02-21 NOTE — Telephone Encounter (Signed)
Plan of care signed and faxed back to [facility] at [phone]. Form sent for scanning.  

## 2022-02-27 DIAGNOSIS — M5442 Lumbago with sciatica, left side: Secondary | ICD-10-CM | POA: Diagnosis not present

## 2022-03-01 ENCOUNTER — Institutional Professional Consult (permissible substitution): Payer: Self-pay | Admitting: Neurology

## 2022-03-01 DIAGNOSIS — M5459 Other low back pain: Secondary | ICD-10-CM | POA: Diagnosis not present

## 2022-03-01 DIAGNOSIS — M5442 Lumbago with sciatica, left side: Secondary | ICD-10-CM | POA: Diagnosis not present

## 2022-03-12 ENCOUNTER — Ambulatory Visit: Payer: BC Managed Care – PPO | Admitting: Internal Medicine

## 2022-03-12 ENCOUNTER — Encounter: Payer: Self-pay | Admitting: Internal Medicine

## 2022-04-10 ENCOUNTER — Encounter: Payer: Self-pay | Admitting: Neurology

## 2022-04-10 ENCOUNTER — Institutional Professional Consult (permissible substitution): Payer: BC Managed Care – PPO | Admitting: Neurology

## 2022-04-10 ENCOUNTER — Telehealth: Payer: Self-pay | Admitting: *Deleted

## 2022-04-10 NOTE — Telephone Encounter (Signed)
Pt did not show for today's appointment.  (2nd no show).  03-01-2022 then 04-10-2022.   ?

## 2022-04-18 ENCOUNTER — Other Ambulatory Visit: Payer: Self-pay | Admitting: Internal Medicine

## 2022-06-01 ENCOUNTER — Telehealth: Payer: BC Managed Care – PPO | Admitting: Emergency Medicine

## 2022-06-01 DIAGNOSIS — R197 Diarrhea, unspecified: Secondary | ICD-10-CM | POA: Diagnosis not present

## 2022-06-01 NOTE — Progress Notes (Signed)
We are sorry that you are not feeling well.  Here is how we plan to help!  Based on what you have shared with me it looks like you have Acute Infectious Diarrhea.  Most cases of acute diarrhea are due to infections with virus and bacteria and are self-limited conditions lasting less than 14 days.  For your symptoms you may take Imodium 2 mg tablets that are over the counter at your local pharmacy. Take two tablet now and then one after each loose stool up to 6 a day.   Antibiotics are not needed for most people with diarrhea.   HOME CARE We recommend changing your diet to help with your symptoms for the next few days. Drink plenty of fluids that contain water salt and sugar. Sports drinks such as Gatorade may help.  You may try broths, soups, bananas, applesauce, soft breads, mashed potatoes or crackers.  You are considered infectious for as long as the diarrhea continues. Hand washing or use of alcohol based hand sanitizers is recommend. It is best to stay out of work or school until your symptoms stop.   GET HELP RIGHT AWAY If you have dark yellow colored urine or do not pass urine frequently you should drink more fluids.   If your symptoms worsen  If you feel like you are going to pass out (faint) You have a new problem  MAKE SURE YOU  Understand these instructions. Will watch your condition. Will get help right away if you are not doing well or get worse.  Thank you for choosing an e-visit.  Your e-visit answers were reviewed by a board certified advanced clinical practitioner to complete your personal care plan. Depending upon the condition, your plan could have included both over the counter or prescription medications.  Please review your pharmacy choice. Make sure the pharmacy is open so you can pick up prescription now. If there is a problem, you may contact your provider through [employer] and have the prescription routed to another pharmacy.  Your safety is  important to [place]. If you have drug allergies check your prescription carefully.   For the next 24 hours you can use MyChart to ask questions about today's visit, request a non-urgent call back, or ask for a work or school excuse. You will get an email in the next two days asking about your experience. I hope that your e-visit has been valuable and will speed your recovery.  I have spent 5 minutes in review of e-visit questionnaire, review and updating patient chart, medical decision making and response to patient.   Rica Mast, PhD, FNP-BC

## 2022-06-07 ENCOUNTER — Telehealth: Payer: Self-pay

## 2022-06-07 ENCOUNTER — Ambulatory Visit: Payer: BC Managed Care – PPO | Admitting: Internal Medicine

## 2022-06-07 NOTE — Telephone Encounter (Signed)
Letter mailed

## 2022-06-07 NOTE — Telephone Encounter (Signed)
Multiple no shows-   11/16/2020 03/12/2022 06/07/22  Would you like to begin dismissal process?

## 2022-06-07 NOTE — Telephone Encounter (Signed)
Send him a letter stating  that further no-shows will trigger dismissal from the practice

## 2022-07-23 ENCOUNTER — Encounter: Payer: Self-pay | Admitting: Internal Medicine

## 2022-08-14 DIAGNOSIS — M25562 Pain in left knee: Secondary | ICD-10-CM | POA: Diagnosis not present

## 2022-09-24 DIAGNOSIS — M1712 Unilateral primary osteoarthritis, left knee: Secondary | ICD-10-CM | POA: Diagnosis not present

## 2022-11-28 ENCOUNTER — Encounter: Payer: BC Managed Care – PPO | Admitting: Internal Medicine

## 2023-01-20 ENCOUNTER — Other Ambulatory Visit: Payer: Self-pay | Admitting: Internal Medicine

## 2023-02-03 ENCOUNTER — Other Ambulatory Visit: Payer: Self-pay | Admitting: Internal Medicine

## 2023-02-22 ENCOUNTER — Other Ambulatory Visit: Payer: Self-pay | Admitting: Adult Health

## 2023-02-22 DIAGNOSIS — F411 Generalized anxiety disorder: Secondary | ICD-10-CM

## 2023-02-22 DIAGNOSIS — F410 Panic disorder [episodic paroxysmal anxiety] without agoraphobia: Secondary | ICD-10-CM

## 2023-02-24 NOTE — Telephone Encounter (Signed)
Please call to schedule, was due in February.

## 2023-02-25 NOTE — Telephone Encounter (Signed)
Lvm for patient to call and schedule 

## 2023-03-10 ENCOUNTER — Other Ambulatory Visit: Payer: Self-pay | Admitting: Internal Medicine

## 2023-03-11 ENCOUNTER — Encounter: Payer: Self-pay | Admitting: *Deleted

## 2023-03-18 ENCOUNTER — Encounter: Payer: Self-pay | Admitting: Adult Health

## 2023-03-18 ENCOUNTER — Ambulatory Visit (INDEPENDENT_AMBULATORY_CARE_PROVIDER_SITE_OTHER): Payer: BC Managed Care – PPO | Admitting: Adult Health

## 2023-03-18 DIAGNOSIS — F411 Generalized anxiety disorder: Secondary | ICD-10-CM | POA: Diagnosis not present

## 2023-03-18 DIAGNOSIS — F410 Panic disorder [episodic paroxysmal anxiety] without agoraphobia: Secondary | ICD-10-CM | POA: Diagnosis not present

## 2023-03-18 MED ORDER — ALPRAZOLAM 0.25 MG PO TABS: 0.2500 mg | ORAL_TABLET | Freq: Every evening | ORAL | 2 refills | Status: DC | PRN

## 2023-03-18 MED ORDER — SERTRALINE HCL 100 MG PO TABS: 100.0000 mg | ORAL_TABLET | Freq: Every day | ORAL | 3 refills | Status: DC

## 2023-03-18 NOTE — Progress Notes (Signed)
Jorge Schwartz [id number] 1971-02-01 52 y.o.  Subjective:   Patient ID:  Jorge Schwartz is a 52 y.o. (DOB 09-11-1971) male.  Chief Complaint: No chief complaint on file.   HPI Jorge Schwartz presents to the office today for follow-up of anxiety, fear of flying, and panic attacks.  Describes mood today as "ok". Pleasant. Mood symptoms - denies depression and irritability. Feels anxious at times - "a few times a year - 5 to 10 times". Using Xanax as needed. Mood is consistent. Stating "I feel like I'm doing ok". Family dong well. Stable interest and motivation. Taking medications as prescribed.  Energy levels stable. Active, has a regular exercise routine. Tennis. Enjoys some usual interests and activities. Married. Lives with wife of has 16-37 year old sons. No family local. Appetite adequate - [employer] diet. Weight fluctuates - 200 pounds.  Sleeps well most nights. Averages 7 hours. Focus and concentration stable. Completing tasks. Managing aspects of household. Works full time. Denies SI or HI.  Denies AH or VH. Denies self harm. Denies substance use.  Previous medication trials: Clonazepam   PHQ2-9    Flowsheet Row Office Visit from 10/09/2021 in [facility] at [facility] Office Visit from 07/15/2020 in [facility] at [facility] Office Visit from 10/15/2018 in [facility] at [facility]  PHQ-2 Total Score 0 0 0        Review of Systems:  Review of Systems  Musculoskeletal:  Negative for gait problem.  Neurological:  Negative for tremors.  Psychiatric/Behavioral:         Please refer to HPI    Medications: I have reviewed the patient's current medications.  Current Outpatient Medications  Medication Sig Dispense Refill   ALPRAZolam (XANAX) 0.25 MG tablet Take 1 tablet (0.25 mg total) by mouth at bedtime as needed for anxiety. 30 tablet 2   amLODipine (NORVASC) 5 MG tablet TAKE 1  TABLET (5 MG TOTAL) BY MOUTH DAILY. 30 tablet 3   cetirizine (ZYRTEC) 10 MG tablet Take 10 mg by mouth daily as needed.       Diclofenac Sodium (PENNSAID) 2 % SOLN Place 1 application onto the skin 2 (two) times daily. 112 g 2   sertraline (ZOLOFT) 100 MG tablet Take 1 tablet (100 mg total) by mouth daily. 90 tablet 3   No current facility-administered medications for this visit.    Medication Side Effects: None  Allergies: No Known Allergies  Past Medical History:  Diagnosis Date   COVID-19 virus infection 08/2020   Essential hypertension    Overweight (BMI 25.0-29.9)     Past Medical History, Surgical history, Social history, and Family history were reviewed and updated as appropriate.   Please see review of systems for further details on the patient's review from today.   Objective:   Physical Exam:  There were no vitals taken for this visit.  Physical Exam Constitutional:      General: He is not in acute distress. Musculoskeletal:        General: No deformity.  Neurological:     Mental Status: He is alert and oriented to person, place, and time.     Coordination: Coordination normal.  Psychiatric:        Attention and Perception: Attention and perception normal. He does not perceive auditory or visual hallucinations.        Mood and Affect: Mood normal. Mood is not anxious or depressed. Affect is not labile,  blunt, angry or inappropriate.        Speech: Speech normal.        Behavior: Behavior normal.        Thought Content: Thought content normal. Thought content is not paranoid or delusional. Thought content does not include homicidal or suicidal ideation. Thought content does not include homicidal or suicidal plan.        Cognition and Memory: Cognition and memory normal.        Judgment: Judgment normal.     Comments: Insight intact     Lab Review:     Component Value Date/Time   NA 139 10/09/2021 1357   K 4.7 10/09/2021 1357   CL 103 10/09/2021 1357    CO2 30 10/09/2021 1357   GLUCOSE 85 10/09/2021 1357   BUN 12 10/09/2021 1357   CREATININE 0.89 10/09/2021 1357   CREATININE 1.04 07/15/2020 1615   CALCIUM 9.3 10/09/2021 1357   PROT 6.8 10/09/2021 1357   ALBUMIN 4.4 10/09/2021 1357   AST 13 10/09/2021 1357   ALT 13 10/09/2021 1357   ALKPHOS 56 10/09/2021 1357   BILITOT 0.9 10/09/2021 1357       Component Value Date/Time   WBC 5.6 10/09/2021 1357   RBC 4.47 10/09/2021 1357   HGB 13.2 10/09/2021 1357   HCT 39.0 10/09/2021 1357   PLT 328.0 10/09/2021 1357   MCV 87.2 10/09/2021 1357   MCH 28.9 07/15/2020 1615   MCHC 33.8 10/09/2021 1357   RDW 13.3 10/09/2021 1357   LYMPHSABS 1.9 10/09/2021 1357   MONOABS 0.4 10/09/2021 1357   EOSABS 0.2 10/09/2021 1357   BASOSABS 0.0 10/09/2021 1357    No results found for: "POCLITH", "LITHIUM"   No results found for: "PHENYTOIN", "PHENOBARB", "VALPROATE", "CBMZ"   .res Assessment: Plan:     Plan:  PDMP reviewed  1. Continue Zoloft  daily 2. Xanax 0.25mg  daily prn - uses a few times a year - none needed today.  RTC 1 year  Patient advised to contact office with any questions, adverse effects, or acute worsening in signs and symptoms.  Discussed potential benefits, risk, and side effects of benzodiazepines to include potential risk of tolerance and dependence, as well as possible drowsiness.  Advised patient not to drive if experiencing drowsiness and to take lowest possible effective dose to minimize risk of dependence and tolerance.  Diagnoses and all orders for this visit:  Generalized anxiety disorder -     sertraline (ZOLOFT) 100 MG tablet; Take 1 tablet (100 mg total) by mouth daily. -     ALPRAZolam (XANAX) 0.25 MG tablet; Take 1 tablet (0.25 mg total) by mouth at bedtime as needed for anxiety.  Panic attacks -     sertraline (ZOLOFT) 100 MG tablet; Take 1 tablet (100 mg total) by mouth daily. -     ALPRAZolam (XANAX) 0.25 MG tablet; Take 1 tablet (0.25 mg total) by  mouth at bedtime as needed for anxiety.     Please see After Visit Summary for patient specific instructions.  No future appointments.   No orders of the defined types were placed in this encounter.   -------------------------------

## 2023-03-21 DIAGNOSIS — G8929 Other chronic pain: Secondary | ICD-10-CM | POA: Diagnosis not present

## 2023-03-21 DIAGNOSIS — M25562 Pain in left knee: Secondary | ICD-10-CM | POA: Diagnosis not present

## 2023-03-21 DIAGNOSIS — M2352 Chronic instability of knee, left knee: Secondary | ICD-10-CM | POA: Diagnosis not present

## 2023-03-27 HISTORY — PX: KNEE ARTHROSCOPY W/ MENISCAL REPAIR: SHX1877

## 2023-04-05 DIAGNOSIS — M25462 Effusion, left knee: Secondary | ICD-10-CM | POA: Diagnosis not present

## 2023-04-11 DIAGNOSIS — S83242A Other tear of medial meniscus, current injury, left knee, initial encounter: Secondary | ICD-10-CM | POA: Diagnosis not present

## 2023-04-24 DIAGNOSIS — M6752 Plica syndrome, left knee: Secondary | ICD-10-CM | POA: Diagnosis not present

## 2023-04-24 DIAGNOSIS — M94262 Chondromalacia, left knee: Secondary | ICD-10-CM | POA: Diagnosis not present

## 2023-04-24 DIAGNOSIS — M659 Synovitis and tenosynovitis, unspecified: Secondary | ICD-10-CM | POA: Diagnosis not present

## 2023-04-24 DIAGNOSIS — M1712 Unilateral primary osteoarthritis, left knee: Secondary | ICD-10-CM | POA: Diagnosis not present

## 2023-04-24 DIAGNOSIS — Y999 Unspecified external cause status: Secondary | ICD-10-CM | POA: Diagnosis not present

## 2023-04-24 DIAGNOSIS — X58XXXA Exposure to other specified factors, initial encounter: Secondary | ICD-10-CM | POA: Diagnosis not present

## 2023-04-24 DIAGNOSIS — G8918 Other acute postprocedural pain: Secondary | ICD-10-CM | POA: Diagnosis not present

## 2023-04-24 DIAGNOSIS — S83232A Complex tear of medial meniscus, current injury, left knee, initial encounter: Secondary | ICD-10-CM | POA: Diagnosis not present

## 2023-04-30 DIAGNOSIS — Z9889 Other specified postprocedural states: Secondary | ICD-10-CM | POA: Diagnosis not present

## 2023-05-01 DIAGNOSIS — M25662 Stiffness of left knee, not elsewhere classified: Secondary | ICD-10-CM | POA: Diagnosis not present

## 2023-05-01 DIAGNOSIS — X58XXXD Exposure to other specified factors, subsequent encounter: Secondary | ICD-10-CM | POA: Diagnosis not present

## 2023-05-01 DIAGNOSIS — S83242D Other tear of medial meniscus, current injury, left knee, subsequent encounter: Secondary | ICD-10-CM | POA: Diagnosis not present

## 2023-05-01 DIAGNOSIS — Z9889 Other specified postprocedural states: Secondary | ICD-10-CM | POA: Diagnosis not present

## 2023-05-01 DIAGNOSIS — R531 Weakness: Secondary | ICD-10-CM | POA: Diagnosis not present

## 2023-05-01 DIAGNOSIS — Z7409 Other reduced mobility: Secondary | ICD-10-CM | POA: Diagnosis not present

## 2023-05-10 DIAGNOSIS — S83242D Other tear of medial meniscus, current injury, left knee, subsequent encounter: Secondary | ICD-10-CM | POA: Diagnosis not present

## 2023-05-10 DIAGNOSIS — Z7409 Other reduced mobility: Secondary | ICD-10-CM | POA: Diagnosis not present

## 2023-05-10 DIAGNOSIS — R531 Weakness: Secondary | ICD-10-CM | POA: Diagnosis not present

## 2023-05-10 DIAGNOSIS — Z9889 Other specified postprocedural states: Secondary | ICD-10-CM | POA: Diagnosis not present

## 2023-05-10 DIAGNOSIS — X58XXXD Exposure to other specified factors, subsequent encounter: Secondary | ICD-10-CM | POA: Diagnosis not present

## 2023-05-10 DIAGNOSIS — M25662 Stiffness of left knee, not elsewhere classified: Secondary | ICD-10-CM | POA: Diagnosis not present

## 2023-05-14 ENCOUNTER — Encounter: Payer: Self-pay | Admitting: Internal Medicine

## 2023-05-14 ENCOUNTER — Ambulatory Visit (INDEPENDENT_AMBULATORY_CARE_PROVIDER_SITE_OTHER): Payer: BC Managed Care – PPO | Admitting: Internal Medicine

## 2023-05-14 VITALS — BP 136/84 | HR 68 | Temp 98.1°F | Resp 16 | Ht 69.5 in | Wt 200.4 lb

## 2023-05-14 DIAGNOSIS — Z1211 Encounter for screening for malignant neoplasm of colon: Secondary | ICD-10-CM

## 2023-05-14 DIAGNOSIS — Z0000 Encounter for general adult medical examination without abnormal findings: Secondary | ICD-10-CM

## 2023-05-14 DIAGNOSIS — I10 Essential (primary) hypertension: Secondary | ICD-10-CM

## 2023-05-14 DIAGNOSIS — R739 Hyperglycemia, unspecified: Secondary | ICD-10-CM | POA: Diagnosis not present

## 2023-05-14 DIAGNOSIS — Z9889 Other specified postprocedural states: Secondary | ICD-10-CM | POA: Diagnosis not present

## 2023-05-14 DIAGNOSIS — M25662 Stiffness of left knee, not elsewhere classified: Secondary | ICD-10-CM | POA: Diagnosis not present

## 2023-05-14 DIAGNOSIS — R531 Weakness: Secondary | ICD-10-CM | POA: Diagnosis not present

## 2023-05-14 DIAGNOSIS — X58XXXD Exposure to other specified factors, subsequent encounter: Secondary | ICD-10-CM | POA: Diagnosis not present

## 2023-05-14 DIAGNOSIS — Z7409 Other reduced mobility: Secondary | ICD-10-CM | POA: Diagnosis not present

## 2023-05-14 DIAGNOSIS — S83242D Other tear of medial meniscus, current injury, left knee, subsequent encounter: Secondary | ICD-10-CM | POA: Diagnosis not present

## 2023-05-14 NOTE — Patient Instructions (Addendum)
Call gastroenterology to schedule a colonoscopy.    336   [phone]  Vaccines I recommend: Covid booster Shingrix (shingles) #2 Flu shot this fall  Check the  blood pressure regularly BP GOAL is between 110/65 and  135/85. If it is consistently higher or lower, let me know     GO TO THE LAB : Get the blood work     GO TO THE FRONT DESK, PLEASE SCHEDULE YOUR APPOINTMENTS Come back for physical exam in 1 year

## 2023-05-14 NOTE — Progress Notes (Unsigned)
Subjective:    Patient ID: Jorge Schwartz, male    DOB: [birth year], 52 y.o.   MRN: [id number]  DOS:  05/14/2023 Type of visit - description: CPX  Here for CPX. In general feels well and has no  concerns.  Review of Systems See above   Past Medical History:  Diagnosis Date   COVID-19 virus infection 08/2020   Essential hypertension    Overweight (BMI 25.0-29.9)     Past Surgical History:  Procedure Laterality Date   KNEE ARTHROSCOPY W/ MENISCECTOMY Left 07/14/2021    Current Outpatient Medications  Medication Instructions   ALPRAZolam (XANAX) 0.25 mg, Oral, At bedtime PRN   amLODipine (NORVASC) 5 mg, Oral, Daily   cetirizine (ZYRTEC) 10 mg, Oral, Daily PRN   Diclofenac Sodium (PENNSAID) 2 % SOLN 1 application , Transdermal, 2 times daily   sertraline (ZOLOFT) 100 mg, Oral, Daily       Objective:   Physical Exam BP 136/84   Pulse 68   Temp 98.1 F (36.7 C) (Oral)   Resp 16   Ht 5' 9.5" (1.765 m)   Wt 200 lb 6 oz (90.9 kg)   SpO2 97%   BMI 29.17 kg/m  General: Well developed, NAD, BMI noted Neck: No  thyromegaly  HEENT:  Normocephalic . Face symmetric, atraumatic Lungs:  CTA B Normal respiratory effort, no intercostal retractions, no accessory muscle use. Heart: RRR,  no murmur.  Abdomen:  Not distended, soft, non-tender. No rebound or rigidity.   Lower extremities: no pretibial edema bilaterally  Skin: Exposed areas without rash. Not pale. Not jaundice Neurologic:  alert & oriented X3.  Speech normal, gait appropriate for age and unassisted Strength symmetric and appropriate for age.  Psych: Cognition and judgment appear intact.  Cooperative with normal attention span and concentration.  Behavior appropriate. No anxious or depressed appearing.     Assessment     Assessment Anxiety HTN urgency dx in [place] 06-2020  PLAN: Here for CPX Td  2017 Vaccines I recommend-Grix #2, Covid booster and  flu shot  CCS: Options discussed elected GI  referral  Prostate ca screening : Distant family members have prostate cancer, no symptoms, checking labs. Labs: CMP FLP CBC A1c PSA Diet and exercise discussed benefits of healthy lifestyle. Anxiety: Controlled, treated by another provider HTN: On amlodipine, BP is normal, no change, encouraged to check ambulatory BPs from time to time. RTC 1 year   The 10-year ASCVD risk score (Arnett DK, et al., 2019) is: 4.2%   Values used to calculate the score:     Age: 38 years     Sex: Male     Is Non-Hispanic African American: No     Diabetic: No     Tobacco smoker: No     Systolic Blood Pressure: 136 mmHg     Is BP treated: Yes     HDL Cholesterol: 46.6 mg/dL     Total Cholesterol: 168 mg/dL   Fatigue, decreased libido: Symptoms started a while back, worse in the last 5 weeks.  He did gain weight last year but weight is coming down.  No snoring. Epworth scale: 11, + screening for OSA noting that sxs could be multifactorial, plan: Plan: Neuro referral, rule out OSA.  Check testosterone/A1c. Reassess in 4 months HTN: Upon arrival BP elevated, I rechecked left arm: 125/80, recommend to get a good-quality cuff and check regularly.  See AVS Back pain: Left-sided mostly, going on for a while, occasional radiation to the left leg.  No paresthesias.  Neurological exam is benign.  Recommend to start with physical therapy.  Referral sent RTC 4 months

## 2023-05-15 ENCOUNTER — Encounter: Payer: Self-pay | Admitting: Internal Medicine

## 2023-05-15 LAB — CBC WITH DIFFERENTIAL/PLATELET
Basophils Absolute: 0.1 10*3/uL (ref 0.0–0.1)
Basophils Relative: 1.3 % (ref 0.0–3.0)
Eosinophils Absolute: 0.3 10*3/uL (ref 0.0–0.7)
Eosinophils Relative: 5 % (ref 0.0–5.0)
HCT: 39.2 % (ref 39.0–52.0)
Hemoglobin: 12.9 g/dL — ABNORMAL LOW (ref 13.0–17.0)
Lymphocytes Relative: 28.5 % (ref 12.0–46.0)
Lymphs Abs: 1.7 10*3/uL (ref 0.7–4.0)
MCHC: 32.9 g/dL (ref 30.0–36.0)
MCV: 88.7 fl (ref 78.0–100.0)
Monocytes Absolute: 0.4 10*3/uL (ref 0.1–1.0)
Monocytes Relative: 6.4 % (ref 3.0–12.0)
Neutro Abs: 3.5 10*3/uL (ref 1.4–7.7)
Neutrophils Relative %: 58.8 % (ref 43.0–77.0)
Platelets: 425 10*3/uL — ABNORMAL HIGH (ref 150.0–400.0)
RBC: 4.41 Mil/uL (ref 4.22–5.81)
RDW: 13.4 % (ref 11.5–15.5)
WBC: 6 10*3/uL (ref 4.0–10.5)

## 2023-05-15 LAB — COMPREHENSIVE METABOLIC PANEL
ALT: 16 U/L (ref 0–53)
AST: 16 U/L (ref 0–37)
Albumin: 4.2 g/dL (ref 3.5–5.2)
Alkaline Phosphatase: 58 U/L (ref 39–117)
BUN: 12 mg/dL (ref 6–23)
CO2: 28 mEq/L (ref 19–32)
Calcium: 9.2 mg/dL (ref 8.4–10.5)
Chloride: 106 mEq/L (ref 96–112)
Creatinine, Ser: 0.96 mg/dL (ref 0.40–1.50)
GFR: 91.47 mL/min (ref >60.00)
Glucose, Bld: 97 mg/dL (ref 70–99)
Potassium: 4.6 mEq/L (ref 3.5–5.1)
Sodium: 141 mEq/L (ref 135–145)
Total Bilirubin: 0.6 mg/dL (ref 0.2–1.2)
Total Protein: 6.9 g/dL (ref 6.0–8.3)

## 2023-05-15 LAB — HEMOGLOBIN A1C: Hgb A1c MFr Bld: 5.3 % (ref 4.6–6.5)

## 2023-05-15 LAB — LIPID PANEL
Cholesterol: 187 mg/dL (ref 0–200)
HDL: 44.3 mg/dL (ref >39.00)
LDL Cholesterol: 109 mg/dL — ABNORMAL HIGH (ref 0–99)
NonHDL: 143.03
Total CHOL/HDL Ratio: 4
Triglycerides: 170 mg/dL — ABNORMAL HIGH (ref 0.0–149.0)
VLDL: 34 mg/dL (ref 0.0–40.0)

## 2023-05-15 LAB — PSA: PSA: 0.8 ng/mL (ref 0.10–4.00)

## 2023-05-15 NOTE — Assessment & Plan Note (Signed)
Td  2017 Vaccines I recommend-  shingrix #2, Covid booster and  flu shot  CCS: Options discussed elected GI referral  Prostate ca screening : Distant family members have prostate cancer, no symptoms, checking labs. Labs: CMP FLP CBC A1c PSA Diet and exercise discussed benefits of healthy lifestyle.

## 2023-05-15 NOTE — Assessment & Plan Note (Signed)
Here for CPX Anxiety: Controlled, treated by another provider HTN: On amlodipine, BP is normal, no change, encouraged to check ambulatory BPs from time to time. RTC 1 year

## 2023-05-20 DIAGNOSIS — Z7409 Other reduced mobility: Secondary | ICD-10-CM | POA: Diagnosis not present

## 2023-05-20 DIAGNOSIS — M25662 Stiffness of left knee, not elsewhere classified: Secondary | ICD-10-CM | POA: Diagnosis not present

## 2023-05-20 DIAGNOSIS — Z9889 Other specified postprocedural states: Secondary | ICD-10-CM | POA: Diagnosis not present

## 2023-05-20 DIAGNOSIS — R531 Weakness: Secondary | ICD-10-CM | POA: Diagnosis not present

## 2023-05-20 DIAGNOSIS — X58XXXD Exposure to other specified factors, subsequent encounter: Secondary | ICD-10-CM | POA: Diagnosis not present

## 2023-05-20 DIAGNOSIS — S83242D Other tear of medial meniscus, current injury, left knee, subsequent encounter: Secondary | ICD-10-CM | POA: Diagnosis not present

## 2023-06-03 DIAGNOSIS — Z7409 Other reduced mobility: Secondary | ICD-10-CM | POA: Diagnosis not present

## 2023-06-03 DIAGNOSIS — R29898 Other symptoms and signs involving the musculoskeletal system: Secondary | ICD-10-CM | POA: Diagnosis not present

## 2023-06-03 DIAGNOSIS — S83242D Other tear of medial meniscus, current injury, left knee, subsequent encounter: Secondary | ICD-10-CM | POA: Diagnosis not present

## 2023-06-03 DIAGNOSIS — R531 Weakness: Secondary | ICD-10-CM | POA: Diagnosis not present

## 2023-06-03 DIAGNOSIS — X58XXXD Exposure to other specified factors, subsequent encounter: Secondary | ICD-10-CM | POA: Diagnosis not present

## 2023-06-03 DIAGNOSIS — Z9889 Other specified postprocedural states: Secondary | ICD-10-CM | POA: Diagnosis not present

## 2023-06-03 DIAGNOSIS — M25662 Stiffness of left knee, not elsewhere classified: Secondary | ICD-10-CM | POA: Diagnosis not present

## 2023-06-03 DIAGNOSIS — S83242A Other tear of medial meniscus, current injury, left knee, initial encounter: Secondary | ICD-10-CM | POA: Diagnosis not present

## 2023-06-10 ENCOUNTER — Encounter: Payer: Self-pay | Admitting: Internal Medicine

## 2023-07-25 ENCOUNTER — Other Ambulatory Visit: Payer: Self-pay

## 2023-07-25 ENCOUNTER — Ambulatory Visit (AMBULATORY_SURGERY_CENTER): Payer: BC Managed Care – PPO

## 2023-07-25 VITALS — Ht 69.5 in | Wt 200.0 lb

## 2023-07-25 DIAGNOSIS — Z1211 Encounter for screening for malignant neoplasm of colon: Secondary | ICD-10-CM

## 2023-07-25 MED ORDER — NA SULFATE-K SULFATE-MG SULF 17.5-3.13-1.6 GM/177ML PO SOLN: 1.0000 | Freq: Once | ORAL | 0 refills | Status: AC

## 2023-07-25 NOTE — Progress Notes (Signed)
Denies allergies to eggs or soy products. Denies complication of anesthesia or sedation. Denies use of weight loss medication. Denies use of O2.   Emmi instructions given for colonoscopy.  

## 2023-07-30 DIAGNOSIS — Z09 Encounter for follow-up examination after completed treatment for conditions other than malignant neoplasm: Secondary | ICD-10-CM | POA: Diagnosis not present

## 2023-08-05 ENCOUNTER — Telehealth: Payer: Self-pay | Admitting: Internal Medicine

## 2023-08-05 ENCOUNTER — Encounter: Payer: Self-pay | Admitting: Internal Medicine

## 2023-08-05 MED ORDER — AMLODIPINE BESYLATE 5 MG PO TABS: 5.0000 mg | ORAL_TABLET | Freq: Every day | ORAL | 2 refills | Status: DC

## 2023-08-05 NOTE — Telephone Encounter (Signed)
Rx sent 

## 2023-08-05 NOTE — Telephone Encounter (Signed)
Prescription Request  08/05/2023  Is this a "Controlled Substance" medicine? Yes  LOV: 05/14/2023  What is the name of the medication or equipment?amLODipine (NORVASC) 5 MG tablet   Have you contacted your pharmacy to request a refill? No   Which pharmacy would you like this sent to?  [id number] [address] [address], [place] - [address] [address] Phone: [phone] Fax: [phone]    Patient notified that their request is being sent to the clinical staff for review and that they should receive a response within 2 business days.   Please advise at [facility] [phone]

## 2023-08-05 NOTE — Addendum Note (Signed)
Addended byConrad [place] D on: 08/05/2023 02:58 PM   Modules accepted: Orders

## 2023-08-14 ENCOUNTER — Telehealth: Payer: Self-pay | Admitting: Gastroenterology

## 2023-08-14 ENCOUNTER — Telehealth: Payer: Self-pay | Admitting: Internal Medicine

## 2023-08-14 NOTE — Telephone Encounter (Signed)
RN called patient per his request; procedure is scheduled for tomorrow, 08/15/23. Patient states he is not running a fever and does not have a sore throat, but is very tired and has a runny nose. RN told patient to monitor his temperature and if he develops a fever or begins showing signs of infection such as sore, red throat, to call us in the morning to cancel and reschedule. Patient stated understanding.

## 2023-08-14 NOTE — Telephone Encounter (Signed)
Inbound call from patient stating has had cold symptoms. Patient states he has not been running a fever. Patient also states he has not been tested for COVID. Patient requesting a call to be advised on tomorrow's 9/19 colonoscopy. Please advise, thank you.

## 2023-08-15 ENCOUNTER — Ambulatory Visit (AMBULATORY_SURGERY_CENTER): Payer: BC Managed Care – PPO | Admitting: Internal Medicine

## 2023-08-15 ENCOUNTER — Encounter: Payer: Self-pay | Admitting: Internal Medicine

## 2023-08-15 VITALS — BP 103/66 | HR 63 | Temp 98.0°F | Resp 13 | Ht 69.0 in | Wt 200.0 lb

## 2023-08-15 DIAGNOSIS — Z1211 Encounter for screening for malignant neoplasm of colon: Secondary | ICD-10-CM | POA: Diagnosis not present

## 2023-08-15 MED ORDER — SODIUM CHLORIDE 0.9 % IV SOLN: 500.0000 mL | INTRAVENOUS | Status: DC

## 2023-08-15 NOTE — Progress Notes (Signed)
Sedate, gd SR, tolerated procedure well, VSS, report to RN 

## 2023-08-15 NOTE — Op Note (Signed)
[facility] Patient Name: Jorge Schwartz Procedure Date: 08/15/2023 10:44 AM MRN: [id number] Endoscopist: Wilhemina Bonito. Marina Goodell , MD, [id number] Age: 52 Referring MD:  Date of Birth: [birth year] Gender: Male Account #: [id number] Procedure:                Colonoscopy Indications:              Screening for colorectal malignant neoplasm Medicines:                Monitored Anesthesia Care Procedure:                Pre-Anesthesia Assessment:                           - Prior to the procedure, a History and Physical                            was performed, and patient medications and                            allergies were reviewed. The patient's tolerance of                            previous anesthesia was also reviewed. The risks                            and benefits of the procedure and the sedation                            options and risks were discussed with the patient.                            All questions were answered, and informed consent                            was obtained. Prior Anticoagulants: The patient has                            taken no anticoagulant or antiplatelet agents. ASA                            Grade Assessment: II - A patient with mild systemic                            disease. After reviewing the risks and benefits,                            the patient was deemed in satisfactory condition to                            undergo the procedure.                           After obtaining informed consent, the colonoscope  was passed under direct vision. Throughout the                            procedure, the patient's blood pressure, pulse, and                            oxygen saturations were monitored continuously. The                            CF [id number] #[id number] was introduced through the anus                            and advanced to the the cecum, identified by                            appendiceal  orifice and ileocecal valve. The                            ileocecal valve, appendiceal orifice, and rectum                            were photographed. The quality of the bowel                            preparation was excellent (except for small focal                            collection of vegetative debris in the cecum.). The                            colonoscopy was performed without difficulty. The                            patient tolerated the procedure well. The bowel                            preparation used was SUPREP via split dose                            instruction. Scope In: 10:51:51 AM Scope Out: 11:03:01 AM Scope Withdrawal Time: 0 hours 9 minutes 5 seconds  Total Procedure Duration: 0 hours 11 minutes 10 seconds  Findings:                 Multiple diverticula were found in the transverse                            colon and right colon.                           The exam was otherwise without abnormality on                            direct and retroflexion views. Complications:  No immediate complications. Estimated blood loss:                            None. Estimated Blood Loss:     Estimated blood loss: none. Impression:               - Diverticulosis in the transverse colon and in the                            right colon.                           - The examination was otherwise normal on direct                            and retroflexion views.                           - No specimens collected. Recommendation:           - Repeat colonoscopy in 10 years for screening                            purposes.                           - Patient has a contact number available for                            emergencies. The signs and symptoms of potential                            delayed complications were discussed with the                            patient. Return to normal activities tomorrow.                            Written discharge  instructions were provided to the                            patient.                           - Resume previous diet.                           - Continue present medications. Wilhemina Bonito. Marina Goodell, MD 08/15/2023 11:08:04 AM This report has been signed electronically.

## 2023-08-15 NOTE — Progress Notes (Signed)
Pt's states no medical or surgical changes since previsit or office visit. 

## 2023-08-15 NOTE — Progress Notes (Signed)
HISTORY OF PRESENT ILLNESS:  Jorge Schwartz is a 52 y.o. male who is in today for routine screening colonoscopy.  No complaints  REVIEW OF SYSTEMS:  All non-GI ROS negative except for  Past Medical History:  Diagnosis Date   Allergy    Anxiety    COVID-19 virus infection 08/2020   Essential hypertension    Overweight (BMI 25.0-29.9)     Past Surgical History:  Procedure Laterality Date   KNEE ARTHROSCOPY W/ MENISCAL REPAIR Left 03/2023   KNEE ARTHROSCOPY W/ MENISCECTOMY Left 07/14/2021    Social History Jorge Schwartz  reports that he has never smoked. He has never used smokeless tobacco. He reports current alcohol use. He reports that he does not use drugs.  family history includes Prostate cancer in an other family member.  No Known Allergies     PHYSICAL EXAMINATION: Vital signs: BP 131/80   Pulse 70   Temp 98 F (36.7 C)   Ht 5\' 9"  (1.753 m)   Wt 200 lb (90.7 kg)   SpO2 97%   BMI 29.53 kg/m  General: Well-developed, well-nourished, no acute distress HEENT: Sclerae are anicteric, conjunctiva pink. Oral mucosa intact Lungs: Clear Heart: Regular Abdomen: soft, nontender, nondistended, no obvious ascites, no peritoneal signs, normal bowel sounds. No organomegaly. Extremities: No edema Psychiatric: alert and oriented x3. Cooperative     ASSESSMENT:  Colon cancer screening   PLAN:   Screening colonoscopy

## 2023-08-15 NOTE — Patient Instructions (Addendum)
Resume previous diet Continue present medications There were no colon polyps seen today!  You will need another screening colonoscopy in 10 years, you will receive a letter at that time when you are due for the procedure.   Please call us at [phone] if you have a change in bowel habits, change in family history of colo-rectal cancer, rectal bleeding or other GI concern before that time.  Handouts/information given for diverticulosis   YOU HAD AN ENDOSCOPIC PROCEDURE TODAY AT THE  ENDOSCOPY CENTER:   Refer to the procedure report that was given to you for any specific questions about what was found during the examination.  If the procedure report does not answer your questions, please call your gastroenterologist to clarify.  If you requested that your care partner not be given the details of your procedure findings, then the procedure report has been included in a sealed envelope for you to review at your convenience later.  YOU SHOULD EXPECT: Some feelings of bloating in the abdomen. Passage of more gas than usual.  Walking can help get rid of the air that was put into your GI tract during the procedure and reduce the bloating. If you had a lower endoscopy (such as a colonoscopy or flexible sigmoidoscopy) you may notice spotting of blood in your stool or on the toilet paper. If you underwent a bowel prep for your procedure, you may not have a normal bowel movement for a few days.  Please Note:  You might notice some irritation and congestion in your nose or some drainage.  This is from the oxygen used during your procedure.  There is no need for concern and it should clear up in a day or so.  SYMPTOMS TO REPORT IMMEDIATELY:  Following lower endoscopy (colonoscopy):  Excessive amounts of blood in the stool  Significant tenderness or worsening of abdominal pains  Swelling of the abdomen that is new, acute  Fever of 100F or higher  For urgent or emergent issues, a gastroenterologist  can be reached at any hour by calling [phone]. Do not use MyChart messaging for urgent concerns.   DIET:  We do recommend a small meal at first, but then you may proceed to your regular diet.  Drink plenty of fluids but you should avoid alcoholic beverages for 24 hours.  ACTIVITY:  You should plan to take it easy for the rest of today and you should NOT DRIVE or use heavy machinery until tomorrow (because of the sedation medicines used during the test).    FOLLOW UP: Our staff will call the number listed on your records the next business day following your procedure.  We will call around 7:15- 8:00 am to check on you and address any questions or concerns that you may have regarding the information given to you following your procedure. If we do not reach you, we will leave a message.     SIGNATURES/CONFIDENTIALITY: You and/or your care partner have signed paperwork which will be entered into your electronic medical record.  These signatures attest to the fact that that the information above on your After Visit Summary has been reviewed and is understood.  Full responsibility of the confidentiality of this discharge information lies with you and/or your care-partner.

## 2023-08-19 ENCOUNTER — Telehealth: Payer: Self-pay | Admitting: *Deleted

## 2023-08-19 NOTE — Telephone Encounter (Signed)
Post procedure follow up call placed, no answer and left VM.  

## 2023-10-11 DIAGNOSIS — Z4889 Encounter for other specified surgical aftercare: Secondary | ICD-10-CM | POA: Diagnosis not present

## 2023-10-11 DIAGNOSIS — M1712 Unilateral primary osteoarthritis, left knee: Secondary | ICD-10-CM | POA: Diagnosis not present

## 2023-11-15 ENCOUNTER — Ambulatory Visit (INDEPENDENT_AMBULATORY_CARE_PROVIDER_SITE_OTHER): Payer: BC Managed Care – PPO | Admitting: Medical

## 2023-11-15 VITALS — BP 130/74 | HR 77 | Temp 97.5°F | Resp 18 | Ht 69.0 in | Wt 201.4 lb

## 2023-11-15 DIAGNOSIS — L089 Local infection of the skin and subcutaneous tissue, unspecified: Secondary | ICD-10-CM | POA: Diagnosis not present

## 2023-11-15 DIAGNOSIS — H1089 Other conjunctivitis: Secondary | ICD-10-CM | POA: Diagnosis not present

## 2023-11-15 MED ORDER — CEPHALEXIN 500 MG PO CAPS: 500.0000 mg | ORAL_CAPSULE | Freq: Two times a day (BID) | ORAL | 0 refills | Status: DC

## 2023-11-15 MED ORDER — TOBRAMYCIN 0.3 % OP SOLN
1.0000 [drp] | Freq: Four times a day (QID) | OPHTHALMIC | 0 refills | Status: DC
Start: 2023-11-15 — End: 2023-12-06

## 2023-11-15 NOTE — Progress Notes (Unsigned)
Subjective:    Patient ID: Jorge Schwartz, male    DOB: 12-Apr-1971, 52 y.o.   MRN: [id number]  HPI Discussed the use of AI scribe software for clinical note transcription with the patient, who gave verbal consent to proceed.  The patient presented with a complaint of discomfort and swelling in the medial canthus area of the right eye, which started on Wednesday. The patient described the discomfort as a soreness, similar to the feeling after playing sports. The swelling was noted to be worse the previous day and had extended to the cheek area. The patient denied any vision changes or discharge from the eye. The patient also reported tenderness in the lower lid of the right eye, which was visibly swollen the previous day. The patient denied any insect bites or other potential causes for the swelling. The patient's symptoms have been ongoing for a couple of days.   Review of Systems  Constitutional:  Negative for chills, fatigue and fever.  HENT:  Negative for congestion.   Eyes:        See hpi.  Respiratory:  Negative for cough, chest tightness, shortness of breath and wheezing.   Cardiovascular:  Negative for chest pain and palpitations.  Genitourinary:  Negative for dysuria, frequency and hematuria.  Neurological:  Negative for dizziness, seizures, weakness and headaches.  Hematological:  Negative for adenopathy. Does not bruise/bleed easily.  Psychiatric/Behavioral:  Negative for behavioral problems and confusion.     Past Medical History:  Diagnosis Date   Allergy    Anxiety    COVID-19 virus infection 08/2020   Essential hypertension    Overweight (BMI 25.0-29.9)      Social History   Socioeconomic History   Marital status: Married    Spouse name: Not on file   Number of children: 2   Years of education: Not on file   Highest education level: Master's degree (e.g., MA, MS, MEng, MEd, MSW, MBA)  Occupational History   Occupation: Scientist, research (physical sciences)  Tobacco Use    Smoking status: Never   Smokeless tobacco: Never  Substance and Sexual Activity   Alcohol use: Yes    Comment: socially    Drug use: Never   Sexual activity: Yes  Other Topics Concern   Not on file  Social History Narrative    Married to Victoria with 2 children. Has a masters degree.  Supply chain mnger n   2 boys (twins) born 2008   Born in [place]   Social Drivers of Health   Financial Resource Strain: Low Risk  (11/15/2023)   Overall Financial Resource Strain (CARDIA)    Difficulty of Paying Living Expenses: Not very hard  Food Insecurity: No Food Insecurity (11/15/2023)   Hunger Vital Sign    Worried About Running Out of Food in the Last Year: Never true    Ran Out of Food in the Last Year: Never true  Transportation Needs: No Transportation Needs (11/15/2023)   PRAPARE - Administrator, Civil Service (Medical): No    Lack of Transportation (Non-Medical): No  Physical Activity: Sufficiently Active (11/15/2023)   Exercise Vital Sign    Days of Exercise per Week: 3 days    Minutes of Exercise per Session: 50 min  Stress: No Stress Concern Present (11/15/2023)   [name] of Occupational Health - Occupational Stress Questionnaire    Feeling of Stress : Only a little  Social Connections: Moderately Integrated (11/15/2023)   Social Connection and Isolation Panel [NHANES]  Frequency of Communication with Friends and Family: Three times a week    Frequency of Social Gatherings with Friends and Family: Once a week    Attends Religious Services: More than 4 times per year    Active Member of [employer] or Organizations: No    Attends Engineer, structural: Not on file    Marital Status: Married  Catering manager Violence: Not on file    Past Surgical History:  Procedure Laterality Date   KNEE ARTHROSCOPY W/ MENISCAL REPAIR Left 03/2023   KNEE ARTHROSCOPY W/ MENISCECTOMY Left 07/14/2021    Family History  Problem Relation Age of Onset    Prostate cancer Other        uncles, cousins   Sudden death Neg Hx    Colon cancer Neg Hx    CAD Neg Hx    Diabetes Neg Hx    Esophageal cancer Neg Hx    Rectal cancer Neg Hx    Stomach cancer Neg Hx     No Known Allergies  Current Outpatient Medications on File Prior to Visit  Medication Sig Dispense Refill   ALPRAZolam (XANAX) 0.25 MG tablet Take 1 tablet (0.25 mg total) by mouth at bedtime as needed for anxiety. 30 tablet 2   amLODipine (NORVASC) 5 MG tablet Take 1 tablet (5 mg total) by mouth daily. 90 tablet 2   cetirizine (ZYRTEC) 10 MG tablet Take 10 mg by mouth daily as needed.       sertraline (ZOLOFT) 100 MG tablet Take 1 tablet (100 mg total) by mouth daily. 90 tablet 3   No current facility-administered medications on file prior to visit.    BP 130/74   Pulse 77   Temp (!) 97.5 F (36.4 C)   Resp 18   Ht 5\' 9"  (1.753 m)   Wt 201 lb 6.4 oz (91.4 kg)   SpO2 95%   BMI 29.74 kg/m        Objective:   Physical Exam  General- No acute distress. Pleasant patient. Neck- Full range of motion, no jvd Lungs- Clear, even and unlabored. Heart- regular rate and rhythm. Neurologic- CNII- XII grossly intact.  Eyes- perrl bilaterall eoms intact. No dc.      Assessment & Plan:   Conjunctivitis for 2 days but also left lower lid swelling and cheek swelling causing concern for skin infection Medial canthus irritation and swelling over the past couple of days. No discharge or vision changes. Cheek swelling noted, worse yesterday, improved today. -Start Tobrex eye drops. -Start Keflex oral antibiotic twice daily for 7 days. -Apply warm compresses to the upper portion and light massages twice daily.  Follow-up -Update via MyChart message on Monday morning. -If symptoms persist, consider referral to optometrist or ophthalmologist.  Esperanza Richters, PA-C

## 2023-11-15 NOTE — Patient Instructions (Signed)
Conjunctivitis for 2 days but also left lower lid swelling and cheek swelling causing concern for skin infection Medial canthus irritation and swelling over the past couple of days. No discharge or vision changes. Cheek swelling noted, worse yesterday, improved today. -Start Tobrex eye drops. -Start Keflex oral antibiotic twice daily for 7 days. -Apply warm compresses to the upper portion and light massages twice daily.  Follow-up -Update via MyChart message on Monday morning. -If symptoms persist, consider referral to optometrist or ophthalmologist.

## 2023-12-06 ENCOUNTER — Encounter: Payer: Self-pay | Admitting: Medical

## 2023-12-06 ENCOUNTER — Telehealth: Payer: BC Managed Care – PPO | Admitting: Physician Assistant

## 2023-12-06 ENCOUNTER — Ambulatory Visit: Payer: Self-pay | Admitting: Internal Medicine

## 2023-12-06 DIAGNOSIS — H1031 Unspecified acute conjunctivitis, right eye: Secondary | ICD-10-CM

## 2023-12-06 MED ORDER — TOBRAMYCIN 0.3 % OP SOLN: 1.0000 [drp] | Freq: Four times a day (QID) | OPHTHALMIC | 0 refills | Status: AC

## 2023-12-06 NOTE — Patient Instructions (Signed)
 [name], thank you for joining [name], PA-C for today's virtual visit.  While this provider is not your primary care provider (PCP), if your PCP is located in our provider database this encounter information will be shared with them immediately following your visit.   A [facility] MyChart account gives you access to today's visit and all your visits, tests, and labs performed at [facility]  click here if you don't have a [facility] MyChart account or go to [email or web address]  Consent: (Patient) Jorge Schwartz provided verbal consent for this virtual visit at the beginning of the encounter.  Current Medications:  Current Outpatient Medications:    ALPRAZolam  (XANAX ) 0.25 MG tablet, Take 1 tablet (0.25 mg total) by mouth at bedtime as needed for anxiety., Disp: 30 tablet, Rfl: 2   amLODipine  (NORVASC ) 5 MG tablet, Take 1 tablet (5 mg total) by mouth daily., Disp: 90 tablet, Rfl: 2   cetirizine (ZYRTEC) 10 MG tablet, Take 10 mg by mouth daily as needed.  , Disp: , Rfl:    sertraline  (ZOLOFT ) 100 MG tablet, Take 1 tablet (100 mg total) by mouth daily., Disp: 90 tablet, Rfl: 3   tobramycin  (TOBREX ) 0.3 % ophthalmic solution, Place 1 drop into the left eye every 6 (six) hours., Disp: 5 mL, Rfl: 0   Medications ordered in this encounter:  No orders of the defined types were placed in this encounter.    *If you need refills on other medications prior to your next appointment, please contact your pharmacy*  Follow-Up: Call back or seek an in-person evaluation if the symptoms worsen or if the condition fails to improve as anticipated.  [facility] [phone]  Other Instructions Bacterial Conjunctivitis, Adult Bacterial conjunctivitis is an infection of your conjunctiva. This is the clear membrane that covers the white part of your eye and the inner part of your eyelid. This infection can make your eye: Red or pink. Itchy or  irritated. This condition spreads easily from person to person (is contagious) and from one eye to the other eye. What are the causes? This condition is caused by germs (bacteria). You may get the infection if you come into close contact with: A person who has the infection. Items that have germs on them (are contaminated), such as face towels, contact lens solution, or eye makeup. What increases the risk? You are more likely to get this condition if: You have contact with people who have the infection. You wear contact lenses. You have a sinus infection. You have had a recent eye injury or surgery. You have a weak body defense system (immune system). You have dry eyes. What are the signs or symptoms?  Thick, yellowish discharge from the eye. Tearing or watery eyes. Itchy eyes. Burning feeling in your eyes. Eye redness. Swollen eyelids. Blurred vision. How is this treated?  Antibiotic eye drops or ointment. Antibiotic medicine taken by mouth. This is used for infections that do not get better with drops or ointment or that last more than 10 days. Cool, wet cloths placed on the eyes. Artificial tears used 2-6 times a day. Follow these instructions at home: Medicines Take or apply your antibiotic medicine as told by your doctor. Do not stop using it even if you start to feel better. Take or apply over-the-counter and prescription medicines only as told by your doctor. Do not touch your eyelid with the eye-drop bottle or the ointment tube. Managing discomfort Wipe any fluid from your eye  with a warm, wet washcloth or a cotton ball. Place a clean, cool, wet cloth on your eye. Do this for 10-20 minutes, 3-4 times a day. General instructions Do not wear contacts until the infection is gone. Wear glasses until your doctor says it is okay to wear contacts again. Do not wear eye makeup until the infection is gone. Throw away old eye makeup. Change or wash your pillowcase every day. Do  not share towels or washcloths. Wash your hands often with soap and water for at least 20 seconds and especially before touching your face or eyes. Use paper towels to dry your hands. Do not touch or rub your eyes. Do not drive or use heavy machinery if your vision is blurred. Contact a doctor if: You have a fever. You do not get better after 10 days. Get help right away if: You have a fever and your symptoms get worse all of a sudden. You have very bad pain when you move your eye. Your face: Hurts. Is red. Is swollen. You have sudden loss of vision. Summary Bacterial conjunctivitis is an infection of your conjunctiva. This infection spreads easily from person to person. Wash your hands often with soap and water for at least 20 seconds and especially before touching your face or eyes. Use paper towels to dry your hands. Take or apply your antibiotic medicine as told by your doctor. Contact a doctor if you have a fever or you do not get better after 10 days. This information is not intended to replace advice given to you by your health care provider. Make sure you discuss any questions you have with your health care provider. Document Revised: 02/22/2021 Document Reviewed: 02/22/2021 Elsevier Patient Education  2024 Elsevier Inc.    If you have been instructed to have an in-person evaluation today at a local Urgent Care facility, please use the link below. It will take you to a list of all of our available [facility], including address, phone number and hours of operation. Please do not delay care.  [facility]  If you or a family member do not have a primary care provider, use the link below to schedule a visit and establish care. When you choose a [facility] primary care physician or advanced practice provider, you gain a long-term partner in health. Find a Primary Care Provider  Learn more about [facility] in-office and virtual care options: [facility] - Get Care Now

## 2023-12-06 NOTE — Progress Notes (Signed)
 Virtual Visit Consent   Jorge Schwartz, you are scheduled for a virtual visit with a  provider today. Just as with appointments in the office, your consent must be obtained to participate. Your consent will be active for this visit and any virtual visit you may have with one of our providers in the next 365 days. If you have a MyChart account, a copy of this consent can be sent to you electronically.  As this is a virtual visit, video technology does not allow for your provider to perform a traditional examination. This may limit your provider's ability to fully assess your condition. If your provider identifies any concerns that need to be evaluated in person or the need to arrange testing (such as labs, EKG, etc.), we will make arrangements to do so. Although advances in technology are sophisticated, we cannot ensure that it will always work on either your end or our end. If the connection with a video visit is poor, the visit may have to be switched to a telephone visit. With either a video or telephone visit, we are not always able to ensure that we have a secure connection.  By engaging in this virtual visit, you consent to the provision of healthcare and authorize for your insurance to be billed (if applicable) for the services provided during this visit. Depending on your insurance coverage, you may receive a charge related to this service.  I need to obtain your verbal consent now. Are you willing to proceed with your visit today? Jorge Schwartz has provided verbal consent on 12/06/2023 for a virtual visit (video or telephone). Jorge Schwartz, [place]  Date: 12/06/2023 2:11 PM  Virtual Visit via Video Note   I, Jorge Schwartz, connected with  Jorge Schwartz  ([id number], 12/02/1970) on 12/06/23 at  2:15 PM EST by a video-enabled telemedicine application and verified that I am speaking with the correct person using two identifiers.  Location: Patient: Virtual Visit  Location Patient: Home Provider: Virtual Visit Location Provider: Home Office   I discussed the limitations of evaluation and management by telemedicine and the availability of in person appointments. The patient expressed understanding and agreed to proceed.    History of Present Illness: Jorge Schwartz is a 53 y.o. who identifies as a male who was assigned male at birth, and is being seen today for redness, irritation, itching and drainage of R eye over the past few days. At sometimes worse than others. Notes some swelling of lower eyelid this morning. Denies vision changes. Denies fever, chills.  Had conjunctivitis of L eye a few weeks ago, treated with Tobramycin  and Keflex  with full resolution.   HPI: HPI  Problems:  Patient Active Problem List   Diagnosis Date Noted   Degenerative tear of medial meniscus of left knee 01/25/2021   Essential hypertension    Overweight (BMI 25.0-29.9)    COVID-19 virus infection 08/2020   Annual physical exam 10/15/2018   PCP NOTES >>>>>>>>>>>>>>>>>> 10/19/2016   Anxiety state 10/19/2016   Allergic rhinitis 01/26/2011    Allergies: No Known Allergies Medications:  Current Outpatient Medications:    ALPRAZolam  (XANAX ) 0.25 MG tablet, Take 1 tablet (0.25 mg total) by mouth at bedtime as needed for anxiety., Disp: 30 tablet, Rfl: 2   amLODipine  (NORVASC ) 5 MG tablet, Take 1 tablet (5 mg total) by mouth daily., Disp: 90 tablet, Rfl: 2   cetirizine (ZYRTEC) 10 MG tablet, Take 10 mg by mouth daily as needed.  , Disp: , Rfl:  sertraline  (ZOLOFT ) 100 MG tablet, Take 1 tablet (100 mg total) by mouth daily., Disp: 90 tablet, Rfl: 3   tobramycin  (TOBREX ) 0.3 % ophthalmic solution, Place 1 drop into the left eye every 6 (six) hours., Disp: 5 mL, Rfl: 0  Observations/Objective: Patient is well-developed, well-nourished in no acute distress.  Resting comfortably at home.  Head is normocephalic, atraumatic.  No labored breathing. Speech is clear and  coherent with logical content.  Patient is alert and oriented at baseline.  R conjunctival injection noted without visible stye formation. Lids without visible edema. No periorbital erythema noted. L conjunctiva within normal limits. Pupils equal and round. EOMI  Assessment and Plan: 1. Acute bacterial conjunctivitis of right eye (Primary)  Will have him start new script of Tobrex  OP for this eye. Supportive measures and OTC medications reviewed. If recurring again, will need ophthalmology assessment.   Follow Up Instructions: I discussed the assessment and treatment plan with the patient. The patient was provided an opportunity to ask questions and all were answered. The patient agreed with the plan and demonstrated an understanding of the instructions.  A copy of instructions were sent to the patient via MyChart unless otherwise noted below.   The patient was advised to call back or seek an in-person evaluation if the symptoms worsen or if the condition fails to improve as anticipated.    Jorge Velma Lunger, PA-C

## 2023-12-06 NOTE — Telephone Encounter (Signed)
 Copied from CRM [id number]. Topic: Clinical - Red Word Triage >> Dec 06, 2023 12:48 PM Robinson DEL wrote: Red Word that prompted transfer to Nurse Triage: Patient states his right eye is swollen, sore, itchy with some discharge. Patient was seen for same issue on 12/20 for left eye.   Chief Complaint: R Eye Problem Symptoms: eye soreness, discharge, swelling, itching Frequency: Ongoing for 3 days Pertinent Negatives: Patient denies fever Disposition: [] ED /[] Urgent Care (no appt availability in office) / [] Appointment(In office/virtual)/ [x]  [facility]/ [] Home Care/ [] Refused Recommended Disposition /[] [facility]/ []  Follow-up with PCP  Additional Notes: Patient reported right eye soreness, discharge, swelling, itching for 3 days. No in-office appointment available until next week. Virtual UC visit scheduled for today.    Answer Assessment - Initial Assessment Questions 1. EYE DISCHARGE: Is the discharge in one or both eyes? What color is it? How much is there? When did the discharge start?      A little bit of yellow discharge  2. REDNESS OF SCLERA: Is the redness in one or both eyes? When did the redness start?      No redness   3. EYELIDS: Are the eyelids red or swollen? If Yes, ask: How much?      Swollen eyelid, especially bottom right  4. VISION: Is there any difficulty seeing clearly?      A little bit blurry from discharge this morning. Vision is currently fine now  5. PAIN: Is there any pain? If Yes, ask: How bad is it? (Scale 1-10; or mild, moderate, severe)    - MILD (1-3): doesn't interfere with normal activities     - MODERATE (4-7): interferes with normal activities or awakens from sleep    - SEVERE (8-10): excruciating pain, unable to do any normal activities       Mild soreness  6. OTHER SYMPTOMS: Do you have any other symptoms? (e.g., fever, runny nose, cough)     Itching, sore  Protocols used: Eye - Pus or  Discharge-A-AH

## 2023-12-24 DIAGNOSIS — Z4889 Encounter for other specified surgical aftercare: Secondary | ICD-10-CM | POA: Diagnosis not present

## 2024-02-19 ENCOUNTER — Ambulatory Visit: Admitting: Internal Medicine

## 2024-02-19 ENCOUNTER — Encounter: Payer: Self-pay | Admitting: Internal Medicine

## 2024-02-19 ENCOUNTER — Ambulatory Visit: Payer: Self-pay | Admitting: *Deleted

## 2024-02-19 VITALS — BP 134/82 | HR 87 | Temp 98.2°F | Resp 18 | Ht 69.0 in | Wt 204.2 lb

## 2024-02-19 DIAGNOSIS — U071 COVID-19: Secondary | ICD-10-CM | POA: Diagnosis not present

## 2024-02-19 LAB — POCT INFLUENZA A/B
Influenza A, POC: NEGATIVE
Influenza B, POC: NEGATIVE

## 2024-02-19 LAB — POC COVID19 BINAXNOW: SARS Coronavirus 2 Ag: POSITIVE — AB

## 2024-02-19 NOTE — Progress Notes (Unsigned)
   Subjective:    Patient ID: Jorge Schwartz, male    DOB: 10-Aug-1971, 53 y.o.   MRN: [id number]  DOS:  02/19/2024 Type of visit - description: acute   Symptoms started 4 days ago: Malaise, fatigue. The last 2 days has been running a temperature of 100, 100.3. Some sinus congestion.  Denies chest pain or difficulty breathing.  No cough. No nausea vomiting. No rash  Review of Systems See above   Past Medical History:  Diagnosis Date   Allergy    Anxiety    COVID-19 virus infection 08/2020   Essential hypertension    Overweight (BMI 25.0-29.9)     Past Surgical History:  Procedure Laterality Date   KNEE ARTHROSCOPY W/ MENISCAL REPAIR Left 03/2023   KNEE ARTHROSCOPY W/ MENISCECTOMY Left 07/14/2021    Current Outpatient Medications  Medication Instructions   ALPRAZolam (XANAX) 0.25 mg, Oral, At bedtime PRN   amLODipine (NORVASC) 5 mg, Oral, Daily   cetirizine (ZYRTEC) 10 mg, Daily PRN   sertraline (ZOLOFT) 100 mg, Oral, Daily       Objective:   Physical Exam BP 134/82   Pulse 87   Temp 98.2 F (36.8 C) (Oral)   Resp 18   Ht 5\' 9"  (1.753 m)   Wt 204 lb 4 oz (92.6 kg)   SpO2 98%   BMI 30.16 kg/m  General:   Well developed, NAD, BMI noted. HEENT:  Normocephalic . Face symmetric, atraumatic TMs normal. Nose: Congested Lungs:  CTA B Normal respiratory effort, no intercostal retractions, no accessory muscle use. Heart: RRR,  no murmur.  Lower extremities: no pretibial edema bilaterally  Skin: Not pale. Not jaundice Neurologic:  alert & oriented X3.  Speech normal, gait appropriate for age and unassisted Psych--  Cognition and judgment appear intact.  Cooperative with normal attention span and concentration.  Behavior appropriate. No anxious or depressed appearing.      Assessment     Assessment Anxiety HTN urgency dx in [place] 06-2020  PLAN: COVID-19: COVID test positive, flu test negative. Presented with a viral syndrome for 4 days, vital  signs stable, nontoxic-appearing. We agreed on conservative treatment with rest, fluids, Tylenol, Mucinex, Astepro. Call if not gradually better.  See AVS. He will be contagious for the last 5 to 6 days, recommend precautions. Recommend a COVID booster in few months.   6 Here for CPX Td  2017 Vaccines I recommend-  shingrix #2, Covid booster and  flu shot  CCS: Options discussed elected GI referral  Prostate ca screening : Distant family members have prostate cancer, no symptoms, checking labs. Labs: CMP FLP CBC A1c PSA Diet and exercise discussed benefits of healthy lifestyle. Anxiety: Controlled, treated by another provider HTN: On amlodipine, BP is normal, no change, encouraged to check ambulatory BPs from time to time. RTC 1 year

## 2024-02-19 NOTE — Telephone Encounter (Signed)
  Chief Complaint: Fever, body aches, weakness since the weekend Symptoms: above Frequency: above Pertinent Negatives: Patient denies nasal congestion or other symptoms Disposition: [] ED /[] Urgent Care (no appt availability in office) / [x] Appointment(In office/virtual)/ []  [facility]/ [] Home Care/ [] Refused Recommended Disposition /[] [facility]/ []  Follow-up with PCP Additional Notes: Appt made with Dr. Drue Novel for this afternoon.

## 2024-02-19 NOTE — Telephone Encounter (Signed)
 Message from [place] [facility] sent at 02/19/2024  8:07 AM EDT  Copied From CRM [id number]. Reason for Triage: Fever of 100 last night, during the night. Patient also experiencing body weakness, and body aches, whereas those symptoms started over the weekend.    Call History  Contact Date/Time Type Contact Phone/Fax By  02/19/2024 07:54 AM EDT Phone (Incoming) [place], Switzer [phone] Peri Jefferson A   Reason for Disposition  Fever present > 3 days (72 hours)  Answer Assessment - Initial Assessment Questions 1. TEMPERATURE: "What is the most recent temperature?"  "How was it measured?"      I have fever.  I took Tylenol 20 min. Ago     Bad body aches. 2. ONSET: "When did the fever start?"      Sat. I was tired so I went to bed early.   Sunday I was weak.   Yesterday I started feeling body aches real bad.  I stayed out of work today.   3. CHILLS: "Do you have chills?" If yes: "How bad are they?"  (e.g., none, mild, moderate, severe)   - NONE: no chills   - MILD: feeling cold   - MODERATE: feeling very cold, some shivering (feels better under a thick blanket)   - SEVERE: feeling extremely cold with shaking chills (general body shaking, rigors; even under a thick blanket)      Mild chills 4. OTHER SYMPTOMS: "Do you have any other symptoms besides the fever?"  (e.g., abdomen pain, cough, diarrhea, earache, headache, sore throat, urination pain)     No runny nose. 5. CAUSE: If there are no symptoms, ask: "What do you think is causing the fever?"      I thought it was allergies but the body aches are really bad. 6. CONTACTS: "Does anyone else in the family have an infection?"     No 7. TREATMENT: "What have you done so far to treat this fever?" (e.g., medications)     Tylenol 8. IMMUNOCOMPROMISE: "Do you have of the following: diabetes, HIV positive, splenectomy, cancer chemotherapy, chronic steroid treatment, transplant patient, etc."     Not asked 9. PREGNANCY: "Is there any chance  you are pregnant?" "When was your last menstrual period?"     N/A 10. TRAVEL: "Have you traveled out of the country in the last month?" (e.g., travel history, exposures)       N/A  Protocols used: [facility]

## 2024-02-19 NOTE — Patient Instructions (Signed)
 You are contagious for the next 5 to 6 days   Rest, fluids , tylenol  For cough:  Take Mucinex DM or Robitussin-DM OTC.  Follow the instructions in the box.   For nasal congestion: -Use over-the-counter Flonase: 2 nasal sprays on each side of the nose in the morning until you feel better  -Use OTC Astepro 2 nasal sprays on each side of the nose twice daily until better   Call if not gradually better over the next  10 days   Call anytime if the symptoms are severe, you have high fever, short of breath, chest pain

## 2024-02-20 NOTE — Assessment & Plan Note (Signed)
 COVID-19: COVID test positive, flu test negative. Presented with a viral syndrome for 4 days, vital signs stable, nontoxic-appearing. We agreed on conservative treatment with rest, fluids, Tylenol, Mucinex, Astepro. Call if not gradually better.  See AVS. He will be contagious for the last 5 to 6 days, recommend precautions. Recommend a COVID booster in few months.

## 2024-02-25 ENCOUNTER — Encounter: Payer: Self-pay | Admitting: Internal Medicine

## 2024-02-25 ENCOUNTER — Ambulatory Visit (INDEPENDENT_AMBULATORY_CARE_PROVIDER_SITE_OTHER): Admitting: Internal Medicine

## 2024-02-25 VITALS — BP 132/70 | HR 79 | Temp 98.0°F | Resp 18 | Ht 69.0 in | Wt 203.0 lb

## 2024-02-25 DIAGNOSIS — U071 COVID-19: Secondary | ICD-10-CM

## 2024-02-25 NOTE — Assessment & Plan Note (Signed)
 COVID-19: Recently diagnosed, treated conservative.  Seems to be doing well with no evidence of any bacterial complications. Although he is feeling better every day, still feels fatigued needed to take a nap for a couple of days. He will take a business trip next week and likes to be sure that is ok. I do not see any problem with him taking a trip, recommend good hydration, DVT prevention during the airplane trip with his isometric leg exercises.

## 2024-02-25 NOTE — Progress Notes (Signed)
   Subjective:    Patient ID: Jorge Schwartz, male    DOB: 04/22/1971, 53 y.o.   MRN: [id number]  DOS:  02/25/2024 Type of visit - description: Follow-up COVID infection  Patient was recently diagnosed with COVID's, symptoms onset 3/22, seen here 02/19/2023. Since then is feeling better gradually. Last day with fever 02/20/2024. Not taking ibuprofen or Tylenol. His main concern is that in the last couple of days having very tired and needed to take a nap. No headache.  No cough.  No myalgia.  No shortness of breath or lower extremity edema   Review of Systems See above   Past Medical History:  Diagnosis Date   Allergy    Anxiety    COVID-19 virus infection 08/2020   Essential hypertension    Overweight (BMI 25.0-29.9)     Past Surgical History:  Procedure Laterality Date   KNEE ARTHROSCOPY W/ MENISCAL REPAIR Left 03/2023   KNEE ARTHROSCOPY W/ MENISCECTOMY Left 07/14/2021    Current Outpatient Medications  Medication Instructions   ALPRAZolam (XANAX) 0.25 mg, Oral, At bedtime PRN   amLODipine (NORVASC) 5 mg, Oral, Daily   cetirizine (ZYRTEC) 10 mg, Daily PRN   sertraline (ZOLOFT) 100 mg, Oral, Daily       Objective:   Physical Exam BP 132/70   Pulse 79   Temp 98 F (36.7 C) (Oral)   Resp 18   Ht 5\' 9"  (1.753 m)   Wt 203 lb (92.1 kg)   SpO2 97%   BMI 29.98 kg/m  General:   Well developed, NAD, BMI noted. HEENT:  Normocephalic . Face symmetric, atraumatic. TMs normal.  Throat normal. Lungs:  CTA B Normal respiratory effort, no intercostal retractions, no accessory muscle use. Heart: RRR,  no murmur.  Lower extremities: no pretibial edema bilaterally  Skin: Not pale. Not jaundice Neurologic:  alert & oriented X3.  Speech normal, gait appropriate for age and unassisted Psych--  Cognition and judgment appear intact.  Cooperative with normal attention span and concentration.  Behavior appropriate. No anxious or depressed appearing.      Assessment      Assessment Anxiety HTN urgency dx in [place] 06-2020  PLAN: COVID-19: Recently diagnosed, treated conservative.  Seems to be doing well with no evidence of any bacterial complications. Although he is feeling better every day, still feels fatigued needed to take a nap for a couple of days. He will take a business trip next week and likes to be sure that is ok. I do not see any problem with him taking a trip, recommend good hydration, DVT prevention during the airplane trip with his isometric leg exercises.

## 2024-03-17 ENCOUNTER — Encounter: Payer: Self-pay | Admitting: Adult Health

## 2024-03-17 ENCOUNTER — Telehealth: Payer: BC Managed Care – PPO | Admitting: Adult Health

## 2024-03-17 DIAGNOSIS — M25561 Pain in right knee: Secondary | ICD-10-CM | POA: Diagnosis not present

## 2024-03-17 DIAGNOSIS — M25562 Pain in left knee: Secondary | ICD-10-CM | POA: Diagnosis not present

## 2024-03-17 DIAGNOSIS — Z9889 Other specified postprocedural states: Secondary | ICD-10-CM | POA: Diagnosis not present

## 2024-03-17 DIAGNOSIS — G8929 Other chronic pain: Secondary | ICD-10-CM | POA: Diagnosis not present

## 2024-03-17 DIAGNOSIS — F419 Anxiety disorder, unspecified: Secondary | ICD-10-CM | POA: Diagnosis not present

## 2024-03-17 DIAGNOSIS — F411 Generalized anxiety disorder: Secondary | ICD-10-CM

## 2024-03-17 DIAGNOSIS — F40243 Fear of flying: Secondary | ICD-10-CM | POA: Diagnosis not present

## 2024-03-17 DIAGNOSIS — F410 Panic disorder [episodic paroxysmal anxiety] without agoraphobia: Secondary | ICD-10-CM

## 2024-03-17 MED ORDER — SERTRALINE HCL 100 MG PO TABS: 100.0000 mg | ORAL_TABLET | Freq: Every day | ORAL | 3 refills | Status: DC

## 2024-03-17 MED ORDER — ALPRAZOLAM 0.25 MG PO TABS: 0.2500 mg | ORAL_TABLET | Freq: Every day | ORAL | 0 refills | Status: DC | PRN

## 2024-03-17 NOTE — Progress Notes (Signed)
 Jorge Schwartz [id number] 07-Mar-1971 53 y.o.  Virtual Visit via Video Note  I connected with pt @ on 03/17/24 at  8:00 AM EDT by a video enabled telemedicine application and verified that I am speaking with the correct person using two identifiers.   I discussed the limitations of evaluation and management by telemedicine and the availability of in person appointments. The patient expressed understanding and agreed to proceed.  I discussed the assessment and treatment plan with the patient. The patient was provided an opportunity to ask questions and all were answered. The patient agreed with the plan and demonstrated an understanding of the instructions.   The patient was advised to call back or seek an in-person evaluation if the symptoms worsen or if the condition fails to improve as anticipated.  I provided 15 minutes of non-face-to-face time during this encounter.  The patient was located at home.  The provider was located at [facility].   Reagan Camera, NP   Subjective:   Patient ID:  Jorge Schwartz is a 53 y.o. (DOB 1971-11-20) male.  Chief Complaint: No chief complaint on file.   HPI Jorge Schwartz presents for follow-up of anxiety, fear of flying, and panic attacks.  Describes mood today as "ok". Pleasant. Mood symptoms - denies anxiety, depression and irritability.  Reports stable interest and motivation. Denies worry, rumination and over thinking. Mood is stable. Stating "I feel like I'm doing alright". Taking medications as prescribed.  Energy levels stable. Active, has a regular exercise routine. Enjoys some usual interests and activities. Married. Lives with wife and 42-24 year old sons. No family local. Appetite adequate. Weight fluctuates - 200 pounds.  Sleeps well most nights. Averages 7 hours. Focus and concentration stable. Completing tasks. Managing aspects of household. Works full time. Denies SI or HI.  Denies AH or VH. Denies self  harm. Denies substance use.  Previous medication trials: Clonazepam  Review of Systems:  Review of Systems  Musculoskeletal:  Negative for gait problem.  Neurological:  Negative for tremors.  Psychiatric/Behavioral:         Please refer to HPI    Medications: I have reviewed the patient's current medications.  Current Outpatient Medications  Medication Sig Dispense Refill   ALPRAZolam  (XANAX ) 0.25 MG tablet Take 1 tablet (0.25 mg total) by mouth at bedtime as needed for anxiety. (Patient not taking: Reported on 02/25/2024) 30 tablet 2   amLODipine  (NORVASC ) 5 MG tablet Take 1 tablet (5 mg total) by mouth daily. 90 tablet 2   cetirizine (ZYRTEC) 10 MG tablet Take 10 mg by mouth daily as needed.       sertraline  (ZOLOFT ) 100 MG tablet Take 1 tablet (100 mg total) by mouth daily. 90 tablet 3   No current facility-administered medications for this visit.    Medication Side Effects: None  Allergies: No Known Allergies  Past Medical History:  Diagnosis Date   Allergy    Anxiety    COVID-19 virus infection 08/2020   Essential hypertension    Overweight (BMI 25.0-29.9)     Family History  Problem Relation Age of Onset   Prostate cancer Other        uncles, cousins   Sudden death Neg Hx    Colon cancer Neg Hx    CAD Neg Hx    Diabetes Neg Hx    Esophageal cancer Neg Hx    Rectal cancer Neg Hx    Stomach cancer Neg Hx     Social History  Socioeconomic History   Marital status: Married    Spouse name: Not on file   Number of children: 2   Years of education: Not on file   Highest education level: Master's degree (e.g., MA, MS, MEng, MEd, MSW, MBA)  Occupational History   Occupation: Scientist, research (physical sciences)  Tobacco Use   Smoking status: Never   Smokeless tobacco: Never  Substance and Sexual Activity   Alcohol use: Yes    Comment: socially    Drug use: Never   Sexual activity: Yes  Other Topics Concern   Not on file  Social History Narrative    Married to [place] with 2 children. Has a masters degree.  Supply chain mnger n   2 boys (twins) born 2008   Born in [place]   Social Drivers of Health   Financial Resource Strain: Low Risk  (11/15/2023)   Overall Financial Resource Strain (CARDIA)    Difficulty of Paying Living Expenses: Not very hard  Food Insecurity: No Food Insecurity (11/15/2023)   Hunger Vital Sign    Worried About Running Out of Food in the Last Year: Never true    Ran Out of Food in the Last Year: Never true  Transportation Needs: No Transportation Needs (11/15/2023)   PRAPARE - Administrator, Civil Service (Medical): No    Lack of Transportation (Non-Medical): No  Physical Activity: Sufficiently Active (11/15/2023)   Exercise Vital Sign    Days of Exercise per Week: 3 days    Minutes of Exercise per Session: 50 min  Stress: No Stress Concern Present (11/15/2023)   [name] of Occupational Health - Occupational Stress Questionnaire    Feeling of Stress : Only a little  Social Connections: Moderately Integrated (11/15/2023)   Social Connection and Isolation Panel [NHANES]    Frequency of Communication with Friends and Family: Three times a week    Frequency of Social Gatherings with Friends and Family: Once a week    Attends Religious Services: More than 4 times per year    Active Member of [employer] or Organizations: No    Attends Engineer, structural: Not on file    Marital Status: Married  Catering manager Violence: Not on file    Past Medical History, Surgical history, Social history, and Family history were reviewed and updated as appropriate.   Please see review of systems for further details on the patient's review from today.   Objective:   Physical Exam:  There were no vitals taken for this visit.  Physical Exam Constitutional:      General: He is not in acute distress. Musculoskeletal:        General: No deformity.  Neurological:     Mental Status: He is alert  and oriented to person, place, and time.     Coordination: Coordination normal.  Psychiatric:        Attention and Perception: Attention and perception normal. He does not perceive auditory or visual hallucinations.        Mood and Affect: Mood normal. Mood is not anxious or depressed. Affect is not labile, blunt, angry or inappropriate.        Speech: Speech normal.        Behavior: Behavior normal.        Thought Content: Thought content normal. Thought content is not paranoid or delusional. Thought content does not include homicidal or suicidal ideation. Thought content does not include homicidal or suicidal plan.  Cognition and Memory: Cognition and memory normal.        Judgment: Judgment normal.     Comments: Insight intact     Lab Review:     Component Value Date/Time   NA 141 05/14/2023 1401   K 4.6 05/14/2023 1401   CL 106 05/14/2023 1401   CO2 28 05/14/2023 1401   GLUCOSE 97 05/14/2023 1401   BUN 12 05/14/2023 1401   CREATININE 0.96 05/14/2023 1401   CREATININE 1.04 07/15/2020 1615   CALCIUM 9.2 05/14/2023 1401   PROT 6.9 05/14/2023 1401   ALBUMIN 4.2 05/14/2023 1401   AST 16 05/14/2023 1401   ALT 16 05/14/2023 1401   ALKPHOS 58 05/14/2023 1401   BILITOT 0.6 05/14/2023 1401       Component Value Date/Time   WBC 6.0 05/14/2023 1401   RBC 4.41 05/14/2023 1401   HGB 12.9 (L) 05/14/2023 1401   HCT 39.2 05/14/2023 1401   PLT 425.0 (H) 05/14/2023 1401   MCV 88.7 05/14/2023 1401   MCH 28.9 07/15/2020 1615   MCHC 32.9 05/14/2023 1401   RDW 13.4 05/14/2023 1401   LYMPHSABS 1.7 05/14/2023 1401   MONOABS 0.4 05/14/2023 1401   EOSABS 0.3 05/14/2023 1401   BASOSABS 0.1 05/14/2023 1401    No results found for: "POCLITH", "LITHIUM"   No results found for: "PHENYTOIN", "PHENOBARB", "VALPROATE", "CBMZ"   .res Assessment: Plan:    Plan:  PDMP reviewed  1. Continue Zoloft  100mg  daily 2. Xanax  0.25mg  daily prn for flight anxiety - uses a few times a year -  none needed today.  RTC 1 year  15 minutes spent dedicated to the care of this patient on the date of this encounter to include pre-visit review of records, ordering of medication, post visit documentation, and face-to-face time with the patient discussing anxiety and panic attacks. Discussed continuing current medication regimen.  Patient advised to contact office with any questions, adverse effects, or acute worsening in signs and symptoms.  Discussed potential benefits, risk, and side effects of benzodiazepines to include potential risk of tolerance and dependence, as well as possible drowsiness.  Advised patient not to drive if experiencing drowsiness and to take lowest possible effective dose to minimize risk of dependence and tolerance.  There are no diagnoses linked to this encounter.   Please see After Visit Summary for patient specific instructions.  Future Appointments  Date Time Provider Department Center  03/17/2024  8:00 AM Poppy Mcafee, Ursula Gardner, NP [facility] None  05/15/2024  8:00 AM Ezell Hollow, MD [facility]    No orders of the defined types were placed in this encounter.     -------------------------------

## 2024-03-18 ENCOUNTER — Ambulatory Visit (INDEPENDENT_AMBULATORY_CARE_PROVIDER_SITE_OTHER): Admitting: Medical

## 2024-03-18 VITALS — BP 112/70 | HR 78 | Temp 97.9°F | Resp 18 | Ht 69.0 in | Wt 204.0 lb

## 2024-03-18 DIAGNOSIS — H019 Unspecified inflammation of eyelid: Secondary | ICD-10-CM

## 2024-03-18 MED ORDER — CEPHALEXIN 500 MG PO CAPS: 500.0000 mg | ORAL_CAPSULE | Freq: Two times a day (BID) | ORAL | 0 refills | Status: DC

## 2024-03-18 MED ORDER — TOBRAMYCIN 0.3 % OP SOLN
2.0000 [drp] | Freq: Four times a day (QID) | OPHTHALMIC | 0 refills | Status: DC
Start: 2024-03-18 — End: 2024-05-15

## 2024-03-18 NOTE — Progress Notes (Signed)
 Subjective:    Patient ID: Jorge Schwartz, male    DOB: 04/21/1971, 53 y.o.   MRN: [id number]  HPI   Discussed the use of AI scribe software for clinical note transcription with the patient, who gave verbal consent to proceed.  History of Present Illness   Jorge Schwartz "Rod" is a 53 year old male who presents with a bump on the left lower eyelid.  He developed a bump on the inside of his left lower eyelid approximately three to four days ago. Initially, he thought it would resolve on its own and used eye drops previously prescribed for conjunctivitis. However, the bump became larger yesterday. It is irritating but not painful, and he is constantly aware of its presence.  He has a history of conjunctivitis with lower lid infection in december and was treated with Tobrex  eye drops and Keflex  for a similar issue involving swelling of the left lower eyelid and cheek about five months ago. At that time, the symptoms resolved within seven days. He has used the Tobrex  eye drops again, applying them once in the morning and once in the evening yesterday, but has not used them more than that.  No vision changes have been noted, and he has not experienced this type/size of bump before. He has not performed warm compresses regularly, having done it only once. He wears glasses but does not have a regular optometrist and has an upcoming appointment with an ophthalmologist in June, which was scheduled prior to the current issue.         [facility] in August.  Review of Systems  Constitutional:  Negative for chills, fatigue and fever.  Eyes:  Negative for pain, redness, itching and visual disturbance.       No dc.  Respiratory:  Negative for cough, chest tightness and wheezing.   Cardiovascular:  Negative for chest pain and palpitations.  Gastrointestinal:  Negative for abdominal pain and blood in stool.  Genitourinary:  Negative for dysuria.  Musculoskeletal:   Negative for back pain and joint swelling.  Skin:  Negative for rash.  Neurological:  Negative for dizziness, speech difficulty, weakness and light-headedness.  Hematological:  Negative for adenopathy. Does not bruise/bleed easily.  Psychiatric/Behavioral:  Negative for behavioral problems and decreased concentration.      Past Medical History:  Diagnosis Date   Allergy    Anxiety    COVID-19 virus infection 08/2020   Essential hypertension    Overweight (BMI 25.0-29.9)      Social History   Socioeconomic History   Marital status: Married    Spouse name: Not on file   Number of children: 2   Years of education: Not on file   Highest education level: Master's degree (e.g., MA, MS, MEng, MEd, MSW, MBA)  Occupational History   Occupation: Scientist, research (physical sciences)  Tobacco Use   Smoking status: Never   Smokeless tobacco: Never  Substance and Sexual Activity   Alcohol use: Yes    Comment: socially    Drug use: Never   Sexual activity: Yes  Other Topics Concern   Not on file  Social History Narrative    Married to [place] with 2 children. Has a masters degree.  Supply chain mnger n   2 boys (twins) born 2008   Born in [place]   Social Drivers of Health   Financial Resource Strain: Low Risk  (11/15/2023)   Overall Financial Resource Strain (CARDIA)    Difficulty  of Paying Living Expenses: Not very hard  Food Insecurity: No Food Insecurity (11/15/2023)   Hunger Vital Sign    Worried About Running Out of Food in the Last Year: Never true    Ran Out of Food in the Last Year: Never true  Transportation Needs: No Transportation Needs (11/15/2023)   PRAPARE - Administrator, Civil Service (Medical): No    Lack of Transportation (Non-Medical): No  Physical Activity: Sufficiently Active (11/15/2023)   Exercise Vital Sign    Days of Exercise per Week: 3 days    Minutes of Exercise per Session: 50 min  Stress: No Stress Concern Present (11/15/2023)   [employer] of Occupational Health - Occupational Stress Questionnaire    Feeling of Stress : Only a little  Social Connections: Moderately Integrated (11/15/2023)   Social Connection and Isolation Panel [NHANES]    Frequency of Communication with Friends and Family: Three times a week    Frequency of Social Gatherings with Friends and Family: Once a week    Attends Religious Services: More than 4 times per year    Active Member of [employer] or Organizations: No    Attends Engineer, structural: Not on file    Marital Status: Married  Catering manager Violence: Not on file    Past Surgical History:  Procedure Laterality Date   KNEE ARTHROSCOPY W/ MENISCAL REPAIR Left 03/2023   KNEE ARTHROSCOPY W/ MENISCECTOMY Left 07/14/2021    Family History  Problem Relation Age of Onset   Prostate cancer Other        uncles, cousins   Sudden death Neg Hx    Colon cancer Neg Hx    CAD Neg Hx    Diabetes Neg Hx    Esophageal cancer Neg Hx    Rectal cancer Neg Hx    Stomach cancer Neg Hx     No Known Allergies  Current Outpatient Medications on File Prior to Visit  Medication Sig Dispense Refill   ALPRAZolam  (XANAX ) 0.25 MG tablet Take 1 tablet (0.25 mg total) by mouth daily as needed for anxiety. 30 tablet 0   amLODipine  (NORVASC ) 5 MG tablet Take 1 tablet (5 mg total) by mouth daily. 90 tablet 2   cetirizine (ZYRTEC) 10 MG tablet Take 10 mg by mouth daily as needed.       sertraline  (ZOLOFT ) 100 MG tablet Take 1 tablet (100 mg total) by mouth daily. 90 tablet 3   No current facility-administered medications on file prior to visit.    BP 112/70   Pulse 78   Temp 97.9 F (36.6 C)   Resp 18   Ht 5\' 9"  (1.753 m)   Wt 204 lb (92.5 kg)   SpO2 97%   BMI 30.13 kg/m          Objective:   Physical Exam  General Mental Status- Alert. General Appearance- Not in acute distress.   Skin General: Color- Normal Color. Moisture- Normal Moisture.  Neck Carotid Arteries- Normal  color. Moisture- Normal Moisture. No carotid bruits. No JVD.  Chest and Lung Exam Auscultation: Breath Sounds:-Normal.  Cardiovascular Auscultation:Rythm- Regular. Murmurs & Other Heart Sounds:Auscultation of the heart reveals- No Murmurs.  Abdomen Inspection:-Inspeection Normal. Palpation/Percussion:Note:No mass. Palpation and Percussion of the abdomen reveal- Non Tender, Non Distended + BS, no rebound or guarding.   Neurologic Cranial Nerve exam:- CN III-XII intact(No nystagmus), symmetric smile.  Strength:- 5/5 equal and symmetric strength both upper and lower extremities.   Eyes- perrl,  left eye lower lid inside of lid large stye like area about 5-6 mm in diameter yellow coloring. No dc. No eye redness.     Assessment & Plan:   Assessment and Plan    Stye of left lower eyelid(concern for spreading infection based on size of area) Stye present for 3-4 days, larger than previous episodes, no vision changes or pain. - Prescribed Tobrex  eye drops. - Prescribed Keflex  500 mg twice daily for 7 days. - Advised warm compresses twice daily. - Monitor for improvement over 7-10 days. - Arrange optometrist referral if no improvement. - Advised taking a photo of the eyelid for future reference.       Coye Dawood, PA-C

## 2024-03-18 NOTE — Patient Instructions (Signed)
 Stye of left lower eyelid(concern for spreading infection based on size of area) Stye present for 3-4 days, larger than previous episodes, no vision changes or pain. - Prescribed Tobrex  eye drops. - Prescribed Keflex  500 mg twice daily for 7 days. - Advised warm compresses twice daily. - Monitor for improvement over 7-10 days. - Arrange optometrist referral if no improvement. - Advised taking a photo of the eyelid for future reference.

## 2024-04-09 DIAGNOSIS — H11221 Conjunctival granuloma, right eye: Secondary | ICD-10-CM | POA: Diagnosis not present

## 2024-04-09 DIAGNOSIS — H5213 Myopia, bilateral: Secondary | ICD-10-CM | POA: Diagnosis not present

## 2024-04-09 DIAGNOSIS — H524 Presbyopia: Secondary | ICD-10-CM | POA: Diagnosis not present

## 2024-04-09 DIAGNOSIS — Z83518 Family history of other specified eye disorder: Secondary | ICD-10-CM | POA: Diagnosis not present

## 2024-05-04 ENCOUNTER — Other Ambulatory Visit: Payer: Self-pay | Admitting: Internal Medicine

## 2024-05-15 ENCOUNTER — Ambulatory Visit: Payer: BC Managed Care – PPO | Admitting: Internal Medicine

## 2024-05-15 ENCOUNTER — Encounter: Payer: Self-pay | Admitting: Internal Medicine

## 2024-05-15 VITALS — BP 122/82 | HR 59 | Temp 98.1°F | Resp 16 | Ht 69.0 in | Wt 206.0 lb

## 2024-05-15 DIAGNOSIS — I10 Essential (primary) hypertension: Secondary | ICD-10-CM

## 2024-05-15 DIAGNOSIS — R739 Hyperglycemia, unspecified: Secondary | ICD-10-CM

## 2024-05-15 DIAGNOSIS — Z0000 Encounter for general adult medical examination without abnormal findings: Secondary | ICD-10-CM | POA: Diagnosis not present

## 2024-05-15 LAB — PSA: PSA: 0.96 ng/mL (ref 0.10–4.00)

## 2024-05-15 LAB — COMPREHENSIVE METABOLIC PANEL WITH GFR
ALT: 14 U/L (ref 0–53)
AST: 15 U/L (ref 0–37)
Albumin: 4.1 g/dL (ref 3.5–5.2)
Alkaline Phosphatase: 54 U/L (ref 39–117)
BUN: 14 mg/dL (ref 6–23)
CO2: 29 meq/L (ref 19–32)
Calcium: 9 mg/dL (ref 8.4–10.5)
Chloride: 108 meq/L (ref 96–112)
Creatinine, Ser: 0.91 mg/dL (ref 0.40–1.50)
GFR: 96.85 mL/min (ref >60.00)
Glucose, Bld: 103 mg/dL — ABNORMAL HIGH (ref 70–99)
Potassium: 4.8 meq/L (ref 3.5–5.1)
Sodium: 142 meq/L (ref 135–145)
Total Bilirubin: 0.6 mg/dL (ref 0.2–1.2)
Total Protein: 6.3 g/dL (ref 6.0–8.3)

## 2024-05-15 LAB — HEMOGLOBIN A1C: Hgb A1c MFr Bld: 5.8 % (ref 4.6–6.5)

## 2024-05-15 LAB — LIPID PANEL
Cholesterol: 179 mg/dL (ref 0–200)
HDL: 43.3 mg/dL (ref >39.00)
LDL Cholesterol: 106 mg/dL — ABNORMAL HIGH (ref 0–99)
NonHDL: 136.06
Total CHOL/HDL Ratio: 4
Triglycerides: 148 mg/dL (ref 0.0–149.0)
VLDL: 29.6 mg/dL (ref 0.0–40.0)

## 2024-05-15 LAB — CBC WITH DIFFERENTIAL/PLATELET
Basophils Absolute: 0.1 10*3/uL (ref 0.0–0.1)
Basophils Relative: 1 % (ref 0.0–3.0)
Eosinophils Absolute: 0.3 10*3/uL (ref 0.0–0.7)
Eosinophils Relative: 4.3 % (ref 0.0–5.0)
HCT: 39 % (ref 39.0–52.0)
Hemoglobin: 12.9 g/dL — ABNORMAL LOW (ref 13.0–17.0)
Lymphocytes Relative: 26.8 % (ref 12.0–46.0)
Lymphs Abs: 1.6 10*3/uL (ref 0.7–4.0)
MCHC: 33.2 g/dL (ref 30.0–36.0)
MCV: 87.3 fl (ref 78.0–100.0)
Monocytes Absolute: 0.4 10*3/uL (ref 0.1–1.0)
Monocytes Relative: 6.7 % (ref 3.0–12.0)
Neutro Abs: 3.8 10*3/uL (ref 1.4–7.7)
Neutrophils Relative %: 61.2 % (ref 43.0–77.0)
Platelets: 386 10*3/uL (ref 150.0–400.0)
RBC: 4.47 Mil/uL (ref 4.22–5.81)
RDW: 14.1 % (ref 11.5–15.5)
WBC: 6.2 10*3/uL (ref 4.0–10.5)

## 2024-05-15 NOTE — Patient Instructions (Signed)
 Vaccines are recommended Get your second Shingrix shot COVID booster Flu shot every fall   Stretch regularly to help plantar fasciitis  Check the  blood pressure regularly Blood pressure goal:  between 110/65 and  135/85. If it is consistently higher or lower, let me know     GO TO THE LAB :  Get the blood work   Your results will be posted on MyChart with my comments  Next office visit for a physical exam in 1 year Please make an appointment before you leave today    Plantar Fasciitis: What to Know  Your plantar fascia is a band of thick tissue on the bottom of your foot. It connects your heel bone to the base of your toes. If the fascia gets irritated, it can cause pain in your heel or foot. This is called plantar fasciitis. In some cases, plantar fasciitis can make it hard for you to walk or move. The pain is often worse in the morning after sleeping, or after sitting or lying down for a long time. Pain may also be worse after walking or standing for a long time. What are the causes? Plantar fasciitis may be caused by: Standing for a long time. Wearing shoes that don't have good arch support. Doing high-impact activities. These are things that put stress on your joints. They include: Ballet. Aerobic exercises. These are exercises that make your heart beat faster. Being overweight. Having a way of walking, or gait, that isn't normal. Tight muscles in your calf, which is in the back of your lower leg. High arches in your feet, or flat feet. Starting a new sport or activity. What are the signs or symptoms? The main symptom of plantar fasciitis is heel pain. Your pain may get worse after: You take your first steps after a time of rest. This includes in the morning after you wake up, or after you've been sitting or lying down for a while. Standing still for a long time. Pain may lessen after 30-45 minutes of activity, such as gentle walking. How is this diagnosed? Plantar  fasciitis may be diagnosed based on your medical history, your symptoms, and an exam. Your health care provider will check for: A tender spot on the bottom of your foot. A high arch in your foot, or flat feet. Pain when you move your foot. Trouble moving your foot. You may also have tests. These may include: X-rays. Ultrasound. MRI. How is this treated? Treatment depends on how bad your plantar fasciitis is. It may include: RICE therapy. This stands for rest, ice, pressure (compression), and raising (elevating) the foot. Exercises to stretch your calves and plantar fascia. A night splint. This holds your foot in a stretched, upward position while you sleep. Physical therapy. This can help with symptoms. It can also prevent problems in the future. Shots of a steroid medicine called cortisone. This can help with pain and irritation. Extracorporeal shock wave therapy. This uses electric shocks to stimulate your plantar fascia. If other treatments don't help, you may need to have surgery. Follow these instructions at home: Managing pain, stiffness, and swelling  Use ice, an ice pack, or a frozen bottle of water as told. Place a towel between your skin and the ice. Roll the bottom of your foot over the ice or frozen bottle. Do this for 20 minutes, 2-3 times a day. If your skin turns red, take off the ice right away to prevent skin damage. The risk of damage is higher  if you can't feel pain, heat, or cold. Wear shoes that have air-sole or gel-sole cushions. You could also try soft shoe inserts made for plantar fasciitis. Activity Try not to do things that cause pain. Ask what things are safe for you to do. Exercise as told. Try activities that are low impact. This means that they're easier on your joints. They include: Swimming. Water aerobics. Biking. General instructions Take your medicines only as told. Wear a night splint as told. Loosen the splint if your toes tingle, are numb, or  turn cold and blue. Stay at a healthy weight. Work with your provider to lose weight as needed. Contact a health care provider if: Your symptoms don't go away with treatment. You have pain that gets worse. Your pain makes it hard to move or do everyday things. This information is not intended to replace advice given to you by your health care provider. Make sure you discuss any questions you have with your health care provider. Document Revised: 04/15/2023 Document Reviewed: 04/15/2023 Elsevier Patient Education  2024 [employer].

## 2024-05-15 NOTE — Assessment & Plan Note (Signed)
 Here for CPX Tdap: 2017 Vaccines I recommend-  shingrix #2, Covid booster and  flu shot  CCS: Colonoscopy 08/15/2023, next per GI Prostate ca screening : Distant family members have prostate cancer, no symptoms, checking labs. Labs: CMP FLP CBC A1c PSA He exercises regularly, encouraged heart healthy diet with portion control and decrease carbohydrate

## 2024-05-15 NOTE — Assessment & Plan Note (Signed)
 Here for CPX   Other issues Anxiety: Well-controlled HTN: On amlodipine ,  ambulatory BPs seldom checked by they are essentially normal. Planta fasciitis: sxs c/w this dx, explained patient what this condition is, rec  stretching, 3 times a day initially, then daily.  Consider a shoe insert for high arch.  Call if not better Umbilical hernia: Small and asymptomatic, we agreed on observation for now. RTC 1 year

## 2024-05-15 NOTE — Progress Notes (Signed)
 Subjective:    Patient ID: Jorge Schwartz, male    DOB: 09-19-1971, 53 y.o.   MRN: [id number]  DOS:  05/15/2024 Type of visit - description: CPX  Here for CPX  Will also talk about his blood pressure. Also, has noted a sharp pain at the right heel mostly early in the morning when he wakes up.  No injury, no swelling. Has noted a small bulging area at the umbilicus.   Wt Readings from Last 3 Encounters:  05/15/24 206 lb (93.4 kg)  03/18/24 204 lb (92.5 kg)  02/25/24 203 lb (92.1 kg)     Review of Systems  Other than above, a 14 point review of systems is negative    Past Medical History:  Diagnosis Date   Allergy    Anxiety    COVID-19 virus infection 08/2020   Essential hypertension    Overweight (BMI 25.0-29.9)     Past Surgical History:  Procedure Laterality Date   KNEE ARTHROSCOPY W/ MENISCAL REPAIR Left 03/2023   KNEE ARTHROSCOPY W/ MENISCECTOMY Left 07/14/2021   Social History   Socioeconomic History   Marital status: Married    Spouse name: Not on file   Number of children: 2   Years of education: Not on file   Highest education level: Master's degree (e.g., MA, MS, MEng, MEd, MSW, MBA)  Occupational History   Occupation: Scientist, research (physical sciences)  Tobacco Use   Smoking status: Never   Smokeless tobacco: Never  Substance and Sexual Activity   Alcohol use: Yes    Comment: socially    Drug use: Never   Sexual activity: Yes  Other Topics Concern   Not on file  Social History Narrative    Married to [place] with 2 children. Has a masters degree.  Supply chain mnger n   2 boys (twins) born 2008   Born in [place]   Social Drivers of Health   Financial Resource Strain: Low Risk  (11/15/2023)   Overall Financial Resource Strain (CARDIA)    Difficulty of Paying Living Expenses: Not very hard  Food Insecurity: No Food Insecurity (11/15/2023)   Hunger Vital Sign    Worried About Running Out of Food in the Last Year: Never true    Ran Out of  Food in the Last Year: Never true  Transportation Needs: No Transportation Needs (11/15/2023)   PRAPARE - Administrator, Civil Service (Medical): No    Lack of Transportation (Non-Medical): No  Physical Activity: Sufficiently Active (11/15/2023)   Exercise Vital Sign    Days of Exercise per Week: 3 days    Minutes of Exercise per Session: 50 min  Stress: No Stress Concern Present (11/15/2023)   [name] of Occupational Health - Occupational Stress Questionnaire    Feeling of Stress : Only a little  Social Connections: Moderately Integrated (11/15/2023)   Social Connection and Isolation Panel    Frequency of Communication with Friends and Family: Three times a week    Frequency of Social Gatherings with Friends and Family: Once a week    Attends Religious Services: More than 4 times per year    Active Member of [employer] or Organizations: No    Attends Engineer, structural: Not on file    Marital Status: Married  Catering manager Violence: Not on file     Current Outpatient Medications  Medication Instructions   ALPRAZolam  (XANAX ) 0.25 mg, Oral, Daily PRN   amLODipine  (NORVASC ) 5 mg, Oral, Daily  cetirizine (ZYRTEC) 10 mg, Daily PRN   sertraline  (ZOLOFT ) 100 mg, Oral, Daily       Objective:   Physical Exam BP 122/82   Pulse (!) 59   Temp 98.1 F (36.7 C) (Oral)   Resp 16   Ht 5' 9 (1.753 m)   Wt 206 lb (93.4 kg)   SpO2 98%   BMI 30.42 kg/m  General: Well developed, NAD, BMI noted Neck: No  thyromegaly  HEENT:  Normocephalic . Face symmetric, atraumatic Lungs:  CTA B Normal respiratory effort, no intercostal retractions, no accessory muscle use. Heart: RRR,  no murmur.  Abdomen:  Not distended, soft, non-tender. No rebound or rigidity.  1 cm reducible nontender umbilical hernia noted Lower extremities: no pretibial edema bilaterally.  Feet exam normal except for slightly high arch Skin: Exposed areas without rash. Not pale. Not  jaundice Neurologic:  alert & oriented X3.  Speech normal, gait appropriate for age and unassisted Strength symmetric and appropriate for age.  Psych: Cognition and judgment appear intact.  Cooperative with normal attention span and concentration.  Behavior appropriate. No anxious or depressed appearing.     Assessment     Assessment Anxiety HTN urgency dx in [place] 06-2020  PLAN: Here for CPX Tdap: 2017 Vaccines I recommend-  shingrix #2, Covid booster and  flu shot  CCS: Colonoscopy 08/15/2023, next per GI Prostate ca screening : Distant family members have prostate cancer, no symptoms, checking labs. Labs: CMP FLP CBC A1c PSA He exercises regularly, encouraged heart healthy diet with portion control and decrease carbohydrate  Other issues Anxiety: Well-controlled HTN: On amlodipine ,  ambulatory BPs seldom checked by they are essentially normal. Planta fasciitis: sxs c/w this dx, explained patient what this condition is, rec  stretching, 3 times a day initially, then daily.  Consider a shoe insert for high arch.  Call if not better Umbilical hernia: Small and asymptomatic, we agreed on observation for now. RTC 1 year

## 2024-05-17 ENCOUNTER — Ambulatory Visit: Payer: Self-pay | Admitting: Internal Medicine

## 2024-06-03 ENCOUNTER — Ambulatory Visit
Admission: RE | Admit: 2024-06-03 | Discharge: 2024-06-03 | Disposition: A | Discharge status: Home / Self Care | Source: Ambulatory Visit | Attending: Family Medicine | Admitting: Family Medicine

## 2024-06-03 VITALS — BP 130/84 | HR 85 | Temp 98.0°F | Resp 17

## 2024-06-03 DIAGNOSIS — B349 Viral infection, unspecified: Secondary | ICD-10-CM

## 2024-06-03 LAB — POC COVID19/FLU A&B COMBO
Covid Antigen, POC: NEGATIVE
Influenza A Antigen, POC: NEGATIVE
Influenza B Antigen, POC: NEGATIVE

## 2024-06-03 NOTE — ED Triage Notes (Signed)
 Pt c/o fatigue, body aches feels like allergies x 3 days -felt [facility] yesterday while playing tennis-denies fever-NAD-steady gait

## 2024-06-03 NOTE — ED Provider Notes (Signed)
 [facility]  Note:  This document was prepared using Conservation officer, historic buildings and may include unintentional dictation errors.  MRN: [id number] DOB: 04-May-1971  Subjective:   Jorge Schwartz is a 53 y.o. male presenting for 3 day history malaise, fatigue, body aches, shortness of breath yesterday while playing tennis. Has had a light cough. Would like COVID and flu testing. No fever, sinus congestion, throat pain, wheezing, chest pain, n/v, abdominal pain, rashes. Has allergies. No asthma or other respiratory disorders. No smoking of any kind including cigarettes, cigars, vaping, marijuana use.  Patient COVID 2 months ago, achieved resolution from that illness.  No current facility-administered medications for this encounter.  Current Outpatient Medications:    ALPRAZolam  (XANAX ) 0.25 MG tablet, Take 1 tablet (0.25 mg total) by mouth daily as needed for anxiety., Disp: 30 tablet, Rfl: 0   amLODipine  (NORVASC ) 5 MG tablet, Take 1 tablet (5 mg total) by mouth daily., Disp: 90 tablet, Rfl: 1   cetirizine (ZYRTEC) 10 MG tablet, Take 10 mg by mouth daily as needed.  , Disp: , Rfl:    sertraline  (ZOLOFT ) 100 MG tablet, Take 1 tablet (100 mg total) by mouth daily., Disp: 90 tablet, Rfl: 3   No Known Allergies  Past Medical History:  Diagnosis Date   Allergy    Anxiety    COVID-19 virus infection 08/2020   Essential hypertension    Overweight (BMI 25.0-29.9)      Past Surgical History:  Procedure Laterality Date   KNEE ARTHROSCOPY W/ MENISCAL REPAIR Left 03/2023   KNEE ARTHROSCOPY W/ MENISCECTOMY Left 07/14/2021    Family History  Problem Relation Age of Onset   Prostate cancer Other        uncles, cousins   Sudden death Neg Hx    Colon cancer Neg Hx    CAD Neg Hx    Diabetes Neg Hx    Esophageal cancer Neg Hx    Rectal cancer Neg Hx    Stomach cancer Neg Hx     Social History   Tobacco Use   Smoking status: Never   Smokeless tobacco:  Never  Vaping Use   Vaping status: Never Used  Substance Use Topics   Alcohol use: Yes    Comment: socially    Drug use: Never    ROS   Objective:   Vitals: BP 130/84 (BP Location: Right Arm)   Pulse 85   Temp 98 F (36.7 C) (Oral)   Resp 17   SpO2 96%   Physical Exam Constitutional:      General: He is not in acute distress.    Appearance: Normal appearance. He is well-developed. He is not ill-appearing, toxic-appearing or diaphoretic.  HENT:     Head: Normocephalic and atraumatic.     Right Ear: External ear normal.     Left Ear: External ear normal.     Nose: Nose normal.     Mouth/Throat:     Mouth: Mucous membranes are moist.  Eyes:     General: No scleral icterus.       Right eye: No discharge.        Left eye: No discharge.     Extraocular Movements: Extraocular movements intact.  Cardiovascular:     Rate and Rhythm: Normal rate and regular rhythm.     Heart sounds: Normal heart sounds. No murmur heard.    No friction rub. No gallop.  Pulmonary:     Effort: Pulmonary effort is normal.  No respiratory distress.     Breath sounds: Normal breath sounds. No stridor. No wheezing, rhonchi or rales.  Neurological:     Mental Status: He is alert and oriented to person, place, and time.  Psychiatric:        Mood and Affect: Mood normal.        Behavior: Behavior normal.        Thought Content: Thought content normal.    Results for orders placed or performed during the hospital encounter of 06/03/24 (from the past 24 hours)  POC Covid19/Flu A&B Antigen     Status: None   Collection Time: 06/03/24  5:25 PM  Result Value Ref Range   Influenza A Antigen, POC Negative Negative   Influenza B Antigen, POC Negative Negative   Covid Antigen, POC Negative Negative    Assessment and Plan :   PDMP not reviewed this encounter.  1. Acute viral syndrome    Deferred imaging given clear cardiopulmonary exam, hemodynamically stable vital signs.  Suspect viral URI,  viral syndrome. Physical exam findings reassuring and vital signs stable for discharge. Advised supportive care, offered symptomatic relief. Counseled patient on potential for adverse effects with medications prescribed/recommended today, ER and return-to-clinic precautions discussed, patient verbalized understanding.     Christopher Savannah, [place] 06/03/24 1731

## 2024-06-03 NOTE — Discharge Instructions (Signed)
 We will manage this as a viral illness. For sore throat or cough try using a honey-based tea. Use 3 teaspoons of honey with juice squeezed from half lemon. Place shaved pieces of ginger into 1/2-1 cup of water and warm over stove top. Then mix the ingredients and repeat every 4 hours as needed. Please take ibuprofen 600mg  every 6 hours with food alternating with OR taken together with Tylenol 500mg -650mg  every 6 hours for throat pain, fevers, aches and pains. Hydrate very well with at least 2 liters of water. Eat light meals such as soups (chicken and noodles, vegetable, chicken and wild rice).  Do not eat foods that you are allergic to.  Taking an antihistamine like Zyrtec (10mg  daily) can help against postnasal drainage, sinus congestion which can cause sinus pain, sinus headaches, throat pain, painful swallowing, coughing.  You can take this together with pseudoephedrine (Sudafed) at a dose of 30mg  3 times a day or twice daily as needed for the same kind of nasal drip, congestion.

## 2024-06-06 ENCOUNTER — Ambulatory Visit
Admission: RE | Admit: 2024-06-06 | Discharge: 2024-06-06 | Disposition: A | Discharge status: Home / Self Care | Attending: Internal Medicine | Admitting: Internal Medicine

## 2024-06-06 ENCOUNTER — Other Ambulatory Visit: Payer: Self-pay

## 2024-06-06 VITALS — BP 140/89 | HR 75 | Temp 97.8°F | Resp 16 | Ht 69.5 in | Wt 198.0 lb

## 2024-06-06 DIAGNOSIS — J309 Allergic rhinitis, unspecified: Secondary | ICD-10-CM

## 2024-06-06 NOTE — ED Triage Notes (Signed)
 Pt presents with complaints of nasal congestion, watery eyes, and increased sinus pressure. States his symptoms have become worse over the last three days. Was seen at [facility] location on 7/9 and diagnosed with common cold. Pt states this is more than a common cold. Currently denies pain, just voicing discomfort. OTC Claritin, Zyrtec, and nasal spray tried at home with no improvement.

## 2024-06-06 NOTE — Discharge Instructions (Addendum)
 Based on your symptoms today I believe that you likely have allergies I have attached some information for you to review to help reduce your contact with triggers and reduce symptom burden I recommend the following at this time:  Start taking a second generation antihistamine such as Allegra, Claritin, Zyrtec, Xyzal, etc (the generics of these medications are typically just as effective and less expensive).  Please use an intranasal steroid such as Flonase  daily to further assist with your symptoms To help with your eye itching I recommend an antihistamine eye drop such as olopatadine or Ketotifen    If you are having trouble finding them, ask the pharmacist to point them out for you.  I also recommend watching the air-quality forecast and take note of high pollen days to help reduce your symptoms If you think other environmental allergens are causing your symptoms (such as mold, pet dander, fragrances, detergents, etc.) please try to identify them and remove them from your daily life. This can mean cleaning your home, bedding, clothing and other aspects of your environment a bit more often or with different products that are appropriate for allergies.

## 2024-06-06 NOTE — ED Provider Notes (Signed)
 [facility]    CSN: [id number] Arrival date & time: 06/06/24  0935      History   Chief Complaint Chief Complaint  Patient presents with   Nasal Congestion    HPI Jorge Schwartz is a 53 y.o. male.   HPI  Pt reports concerns for sinus pressure and itching along sinuses  He states his eyes feel tired and his nose is very itchy  He sometimes has a runny nose. He also reports sneezing  He denies coughing, SOB, chest tightness, fevers, chills   He reports this has been ongoing since Wed AM (06/03/24) but progressed and became worse on Thurs   Interventions: allergy nasal spray, Claritin, Zyrtec  He has been taking these for a few days   He denies hx of asthma but states he has a hx of allergies throughout the year that usually resolves in a day or so    Past Medical History:  Diagnosis Date   Allergy    Anxiety    COVID-19 virus infection 08/2020   Essential hypertension    Overweight (BMI 25.0-29.9)     Patient Active Problem List   Diagnosis Date Noted   Degenerative tear of medial meniscus of left knee 01/25/2021   Essential hypertension    Overweight (BMI 25.0-29.9)    COVID-19 virus infection 08/2020   Annual physical exam 10/15/2018   PCP NOTES >>>>>>>>>>>>>>>>>> 10/19/2016   Anxiety state 10/19/2016   Allergic rhinitis 01/26/2011    Past Surgical History:  Procedure Laterality Date   KNEE ARTHROSCOPY W/ MENISCAL REPAIR Left 03/2023   KNEE ARTHROSCOPY W/ MENISCECTOMY Left 07/14/2021       Home Medications    Prior to Admission medications   Medication Sig Start Date End Date Taking? Authorizing Provider  ALPRAZolam  (XANAX ) 0.25 MG tablet Take 1 tablet (0.25 mg total) by mouth daily as needed for anxiety. 03/17/24   Mozingo, Regina Nattalie, NP  amLODipine  (NORVASC ) 5 MG tablet Take 1 tablet (5 mg total) by mouth daily. 05/04/24   Paz, Jose E, MD  cetirizine (ZYRTEC) 10 MG tablet Take 10 mg by mouth daily as needed.      [provider]  sertraline  (ZOLOFT ) 100 MG tablet Take 1 tablet (100 mg total) by mouth daily. 03/17/24   Mozingo, Regina Nattalie, NP    Family History Family History  Problem Relation Age of Onset   Prostate cancer Other        uncles, cousins   Sudden death Neg Hx    Colon cancer Neg Hx    CAD Neg Hx    Diabetes Neg Hx    Esophageal cancer Neg Hx    Rectal cancer Neg Hx    Stomach cancer Neg Hx     Social History Social History   Tobacco Use   Smoking status: Never   Smokeless tobacco: Never  Vaping Use   Vaping status: Never Used  Substance Use Topics   Alcohol use: Yes    Comment: socially    Drug use: Never     Allergies   Patient has no known allergies.   Review of Systems Review of Systems  Constitutional:  Positive for fatigue. Negative for chills and fever.  HENT:  Positive for sinus pressure, sinus pain and sneezing. Negative for congestion, ear pain, postnasal drip, rhinorrhea and sore throat.   Eyes:  Positive for discharge and itching. Negative for photophobia, pain, redness and visual disturbance.  Respiratory:  Negative for cough, shortness of  breath and wheezing.   Gastrointestinal:  Negative for diarrhea, nausea and vomiting.  Skin:  Negative for rash.  Neurological:  Positive for headaches.     Physical Exam Triage Vital Signs ED Triage Vitals  Encounter Vitals Group     BP 06/06/24 0943 (!) 140/89     Girls Systolic BP Percentile --      Girls Diastolic BP Percentile --      Boys Systolic BP Percentile --      Boys Diastolic BP Percentile --      Pulse Rate 06/06/24 0943 75     Resp 06/06/24 0943 16     Temp 06/06/24 0943 97.8 F (36.6 C)     Temp Source 06/06/24 0943 Oral     SpO2 06/06/24 0943 98 %     Weight 06/06/24 0943 198 lb (89.8 kg)     Height 06/06/24 0943 5' 9.5 (1.765 m)     Head Circumference --      Peak Flow --      Pain Score 06/06/24 0952 0     Pain Loc --      Pain Education --      Exclude from Growth Chart  --    No data found.  Updated Vital Signs BP (!) 140/89 (BP Location: Right Arm) Comment: Took BP meds 30 min PTA  Pulse 75   Temp 97.8 F (36.6 C) (Oral)   Resp 16   Ht 5' 9.5 (1.765 m)   Wt 198 lb (89.8 kg)   SpO2 98%   BMI 28.82 kg/m   Visual Acuity Right Eye Distance:   Left Eye Distance:   Bilateral Distance:    Right Eye Near:   Left Eye Near:    Bilateral Near:     Physical Exam Vitals reviewed.  Constitutional:      General: He is awake. He is not in acute distress.    Appearance: Normal appearance. He is well-developed and well-groomed. He is not ill-appearing or toxic-appearing.  HENT:     Head: Normocephalic and atraumatic.     Right Ear: Hearing and ear canal normal. A middle ear effusion is present.     Left Ear: Hearing and ear canal normal. A middle ear effusion is present.     Ears:     Comments: Very mild mid ear effusions bilaterally     Nose:     Right Nostril: No foreign body, epistaxis, septal hematoma or occlusion.     Left Nostril: No foreign body, epistaxis, septal hematoma or occlusion.     Right Turbinates: Swollen.     Left Turbinates: Swollen.     Comments: Pt is having to sniffle a lot during exam but is able to breathe through nose     Mouth/Throat:     Lips: Pink.     Mouth: Mucous membranes are moist.     Pharynx: Oropharynx is clear. Uvula midline. No pharyngeal swelling, oropharyngeal exudate, posterior oropharyngeal erythema, uvula swelling or postnasal drip.     Tonsils: No tonsillar exudate or tonsillar abscesses.  Eyes:     General: Lids are normal. Gaze aligned appropriately.     Extraocular Movements: Extraocular movements intact.  Cardiovascular:     Rate and Rhythm: Normal rate and regular rhythm.     Heart sounds: Normal heart sounds.  Pulmonary:     Effort: Pulmonary effort is normal.     Breath sounds: Normal breath sounds. No decreased air movement. No decreased breath sounds,  wheezing, rhonchi or rales.   Musculoskeletal:     Cervical back: Normal range of motion and neck supple.  Lymphadenopathy:     Head:     Right side of head: No submental, submandibular or preauricular adenopathy.     Left side of head: No submental, submandibular or preauricular adenopathy.     Cervical:     Right cervical: No superficial cervical adenopathy.    Left cervical: No superficial cervical adenopathy.     Upper Body:     Right upper body: No supraclavicular adenopathy.     Left upper body: No supraclavicular adenopathy.  Skin:    General: Skin is warm and dry.  Neurological:     General: No focal deficit present.     Mental Status: He is alert and oriented to person, place, and time.  Psychiatric:        Mood and Affect: Mood normal.        Behavior: Behavior normal. Behavior is cooperative.        Thought Content: Thought content normal.        Judgment: Judgment normal.      UC Treatments / Results  Labs (all labs ordered are listed, but only abnormal results are displayed) Labs Reviewed - No data to display  EKG   Radiology No results found.  Procedures Procedures (including critical care time)  Medications Ordered in UC Medications - No data to display  Initial Impression / Assessment and Plan / UC Course  I have reviewed the triage vital signs and the nursing notes.  Pertinent labs & imaging results that were available during my care of the patient were reviewed by me and considered in my medical decision making (see chart for details).     *** Final Clinical Impressions(s) / UC Diagnoses   Final diagnoses:  Allergic rhinitis, unspecified seasonality, unspecified trigger     Discharge Instructions      Based on your symptoms today I believe that you likely have allergies I have attached some information for you to review to help reduce your contact with triggers and reduce symptom burden I recommend the following at this time:  Start taking a second generation  antihistamine such as Allegra, Claritin, Zyrtec, Xyzal, etc (the generics of these medications are typically just as effective and less expensive).  Please use an intranasal steroid such as Flonase  daily to further assist with your symptoms To help with your eye itching I recommend an antihistamine eye drop such as olopatadine or Ketotifen    If you are having trouble finding them, ask the pharmacist to point them out for you.  I also recommend watching the air-quality forecast and take note of high pollen days to help reduce your symptoms If you think other environmental allergens are causing your symptoms (such as mold, pet dander, fragrances, detergents, etc.) please try to identify them and remove them from your daily life. This can mean cleaning your home, bedding, clothing and other aspects of your environment a bit more often or with different products that are appropriate for allergies.        ED Prescriptions   None    PDMP not reviewed this encounter.

## 2024-08-07 DIAGNOSIS — M1712 Unilateral primary osteoarthritis, left knee: Secondary | ICD-10-CM | POA: Diagnosis not present

## 2024-08-07 DIAGNOSIS — G8929 Other chronic pain: Secondary | ICD-10-CM | POA: Diagnosis not present

## 2024-08-07 DIAGNOSIS — Z4789 Encounter for other orthopedic aftercare: Secondary | ICD-10-CM | POA: Diagnosis not present

## 2024-09-10 ENCOUNTER — Ambulatory Visit (INDEPENDENT_AMBULATORY_CARE_PROVIDER_SITE_OTHER): Admitting: Family Medicine

## 2024-09-10 VITALS — BP 132/84 | HR 76 | Temp 97.7°F | Resp 16 | Ht 69.0 in | Wt 194.6 lb

## 2024-09-10 DIAGNOSIS — J302 Other seasonal allergic rhinitis: Secondary | ICD-10-CM | POA: Diagnosis not present

## 2024-09-10 MED ORDER — MONTELUKAST SODIUM 10 MG PO TABS
10.0000 mg | ORAL_TABLET | Freq: Every day | ORAL | 3 refills | Status: AC
Start: 2024-09-10 — End: ?

## 2024-09-10 NOTE — Patient Instructions (Signed)
 Stay on the Zyrtec and Nasonex.   Let us  know if you need anything.

## 2024-09-10 NOTE — Progress Notes (Signed)
 Chief Complaint  Patient presents with   Allergies    Allergies    Jorge Altschuler here for URI complaints.  Duration: 6 days  Associated symptoms: rhinorrhea, itchy watery eyes, and sneezing; symptoms come and go Denies: sinus congestion, sinus pain, itchy watery eyes, ear pain, ear drainage, sore throat, wheezing, shortness of breath, myalgia, and fevers, cough Treatment to date: Nasonex and Zyrtec Sick contacts: No  Past Medical History:  Diagnosis Date   Allergy    Anxiety    COVID-19 virus infection 08/2020   Essential hypertension    Overweight (BMI 25.0-29.9)     Objective BP 132/84 (BP Location: Left Arm, Patient Position: Sitting)   Pulse 76   Temp 97.7 F (36.5 C) (Oral)   Resp 16   Ht 5' 9 (1.753 m)   Wt 194 lb 9.6 oz (88.3 kg)   SpO2 96%   BMI 28.74 kg/m  General: Awake, alert, appears stated age HEENT: AT, [place], ears patent b/l and TM's neg, nares patent on L, edematous turbinate with R septal deviation on R, b/l clear discharge, pharynx pink and without exudates, MMM, no sinus ttp Neck: No masses or asymmetry Heart: RRR Lungs: CTAB, no accessory muscle use Psych: Age appropriate judgment and insight, normal mood and affect  Seasonal allergic rhinitis, unspecified trigger - Plan: montelukast (SINGULAIR) 10 MG tablet  Chronic, worsening. Cont Nasonex and Zyrtec. Add Singulair 10 mg/d prn. F/u as originally scheduled w reg PCP.  Pt voiced understanding and agreement to the plan.  Mabel Mt [place], DO 09/10/24 2:40 PM

## 2024-09-29 ENCOUNTER — Ambulatory Visit (INDEPENDENT_AMBULATORY_CARE_PROVIDER_SITE_OTHER): Admitting: Family Medicine

## 2024-09-29 ENCOUNTER — Encounter: Payer: Self-pay | Admitting: Family Medicine

## 2024-09-29 VITALS — BP 132/82 | HR 88 | Temp 98.0°F | Resp 16 | Ht 69.0 in | Wt 191.2 lb

## 2024-09-29 DIAGNOSIS — J302 Other seasonal allergic rhinitis: Secondary | ICD-10-CM | POA: Diagnosis not present

## 2024-09-29 MED ORDER — LEVOCETIRIZINE DIHYDROCHLORIDE 5 MG PO TABS: 5.0000 mg | ORAL_TABLET | Freq: Every evening | ORAL | 3 refills | Status: AC

## 2024-09-29 NOTE — Progress Notes (Signed)
 Chief Complaint  Patient presents with   Follow-up    Follow Up    Subjective: Patient is a 53 y.o. male here for f/u.  Has been taking Zyrtec (has been taking for many years), Singulair and an INCS prn for allergies. Seems to struggle intermittently but overall better.  He does not see an allergist.  He denies any fevers, sore throat, dental pain, sinus pain, wheezing, or shortness of breath.  Past Medical History:  Diagnosis Date   Allergy    Anxiety    COVID-19 virus infection 08/2020   Essential hypertension    Overweight (BMI 25.0-29.9)     Objective: BP 132/82 (BP Location: Left Arm, Patient Position: Sitting)   Pulse 88   Temp 98 F (36.7 C) (Oral)   Resp 16   Ht 5' 9 (1.753 m)   Wt 191 lb 3.2 oz (86.7 kg)   SpO2 98%   BMI 28.24 kg/m  General: Awake, appears stated age HEENT: Ears are patent without otorrhea, TMs negative bilaterally, no sinus TTP, nares are patent without rhinorrhea, MMM, no pharyngeal exudate or erythema Heart: RRR, no LE edema Lungs: CTAB, no rales, wheezes or rhonchi. No accessory muscle use Psych: Age appropriate judgment and insight, normal affect and mood  Assessment and Plan: Seasonal allergic rhinitis, unspecified trigger - Plan: levocetirizine (XYZAL) 5 MG tablet  He will start using his intranasal corticosteroid daily and as a first option.  We will change Zyrtec to Xyzal 5 mg daily.  Continue montelukast 10 mg nightly as needed.  He will send me a message in 1.5-2 weeks if not significantly improving.  At that point we will refer him to the allergist and add Astelin nasal spray twice daily as needed.  He will follow-up with me as needed and with Dr. Amon as originally scheduled. The patient voiced understanding and agreement to the plan.  Mabel Mt [place], DO 09/29/24  4:44 PM

## 2024-09-29 NOTE — Patient Instructions (Addendum)
 Let me know if you wish to see an allergist.  Use your steroid nasal spray daily for now.  Xyzal is over the counter and is replacing your Zyrtec.   Singulair as needed is reasonable.   Send me a message in 1.5-2 weeks if we aren't improving and I will send another nasal spray.  Let us  know if you need anything.

## 2024-11-03 DIAGNOSIS — J309 Allergic rhinitis, unspecified: Secondary | ICD-10-CM | POA: Diagnosis not present

## 2024-11-03 DIAGNOSIS — J301 Allergic rhinitis due to pollen: Secondary | ICD-10-CM | POA: Diagnosis not present

## 2024-11-03 DIAGNOSIS — J3089 Other allergic rhinitis: Secondary | ICD-10-CM | POA: Diagnosis not present

## 2024-11-03 DIAGNOSIS — H1045 Other chronic allergic conjunctivitis: Secondary | ICD-10-CM | POA: Diagnosis not present

## 2024-11-09 ENCOUNTER — Other Ambulatory Visit: Payer: Self-pay | Admitting: Internal Medicine

## 2024-12-14 ENCOUNTER — Ambulatory Visit: Payer: Self-pay

## 2024-12-14 NOTE — Telephone Encounter (Signed)
 FYI Only or Action Required?: FYI only for provider: appointment scheduled on tomorrow afternoon.  Patient was last seen in primary care on 09/29/2024 by Frann Mabel Mt, DO.  Called Nurse Triage reporting Anxiety.  Symptoms began a week ago. Pt is doing better will go to ED if needed tonight.  Interventions attempted: Prescription medications: Xanax  - spoke with work therapist.  Symptoms are: unchanged.  Triage Disposition: See PCP When Office is Open (Within 3 Days)  Patient/caregiver understands and will follow disposition?: Yes                           Reason for Triage: Concern: Increased anxiety ( over the weekend)   Yesterday morning saw an uptick   Symptoms:   Shortness of breath    When did the symptoms start?: last week ( the past 4-5 days)    What have you done to aid in the concern ? Have you taken anything to assist with the matter?: Yes    If so, what did you take?:   ALPRAZolam  (XANAX ) 0.25 MG tablet   sertraline  (ZOLOFT ) 100 MG tablet    Reason for Disposition  MODERATE anxiety (e.g., persistent or frequent anxiety symptoms; interferes with sleep, school, or work)  Answer Assessment - Initial Assessment Questions 1. CONCERN: Did anything happen that prompted you to call today?      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 ANXIETY SYMPTOMS: Can you describe how you (your loved one; patient) have been feeling? (e.g., tense, restless, panicky, anxious, keyed up, overwhelmed, sense of impending doom).      Worried - stress 3. ONSET: How long have you been feeling this way? (e.g., hours, days, weeks)     Just a week ago 4. SEVERITY: How would you rate the level of anxiety? (e.g., 0 - 10; or mild, moderate, severe).     moderate 5. FUNCTIONAL IMPAIRMENT: How have these feelings affected your ability to do daily activities? Have you had more difficulty than usual doing your normal daily activities? (e.g.,  getting better, same, worse; self-care, school, work, interactions)     Pt took a nap yesterday 6. HISTORY: Have you felt this way before? Have you ever been diagnosed with an anxiety problem in the past? (e.g., generalized anxiety disorder, panic attacks, PTSD). If Yes, ask: How was this problem treated? (e.g., medicines, counseling, etc.)     Yes -1 years ago 7. RISK OF HARM - SUICIDAL IDEATION: Do you ever have thoughts of hurting or killing yourself? If Yes, ask:  Do you have these feelings now? Do you have a plan on how you would do this?     no 8. TREATMENT:  What has been done so far to treat this anxiety? (e.g., medicines, relaxation strategies). What has helped?     Taken Xanax  9. THERAPIST: Do you have a counselor or therapist? If Yes, ask: What is their name?     No longer 10. POTENTIAL TRIGGERS: Do you drink caffeinated beverages (e.g., coffee, colas, teas), and how much daily? Do you drink alcohol or use any drugs? Have you started any new medicines recently?      Does not drink alcohol - no issues with Caffeine  11. PATIENT SUPPORT: Who is with you now? Who do you live with? Do you have family or friends who you can talk to?        Spoke with company therapist - who was able to  give him good resources. 12. OTHER SYMPTOMS: Do you have any other symptoms? (e.g., feeling depressed, trouble concentrating, trouble sleeping, trouble breathing, palpitations or fast heartbeat, chest pain, sweating, nausea, or diarrhea)       Panic attack,  Protocols used: Anxiety and Panic Attack-A-AH

## 2024-12-15 ENCOUNTER — Ambulatory Visit: Admitting: Internal Medicine

## 2024-12-15 ENCOUNTER — Ambulatory Visit: Admitting: Medical

## 2024-12-15 ENCOUNTER — Encounter: Payer: Self-pay | Admitting: Internal Medicine

## 2024-12-15 VITALS — BP 138/80 | HR 79 | Temp 97.6°F | Resp 16 | Ht 69.0 in | Wt 175.4 lb

## 2024-12-15 DIAGNOSIS — F411 Generalized anxiety disorder: Secondary | ICD-10-CM

## 2024-12-15 DIAGNOSIS — F410 Panic disorder [episodic paroxysmal anxiety] without agoraphobia: Secondary | ICD-10-CM

## 2024-12-15 DIAGNOSIS — Z23 Encounter for immunization: Secondary | ICD-10-CM | POA: Diagnosis not present

## 2024-12-15 DIAGNOSIS — F419 Anxiety disorder, unspecified: Secondary | ICD-10-CM | POA: Diagnosis not present

## 2024-12-15 MED ORDER — SERTRALINE HCL 100 MG PO TABS: 150.0000 mg | ORAL_TABLET | Freq: Every day | ORAL | Status: DC

## 2024-12-15 NOTE — Patient Instructions (Addendum)
 Increase sertraline  100 mg to 1.5 tablets daily. Continue Xanax  as needed See your psychiatrist as planned Call anytime if you need a visit.  Otherwise I will see you in June for your physical exam

## 2024-12-15 NOTE — Telephone Encounter (Signed)
 Appt today

## 2024-12-15 NOTE — Progress Notes (Unsigned)
 "  Subjective:    Patient ID: Jorge Schwartz, male    DOB: 1971/05/01, 54 y.o.   MRN: [id number]  DOS:  12/15/2024 Acute Discussed the use of AI scribe software for clinical note transcription with the patient, who gave verbal consent to proceed.  History of Present Illness Jorge Schwartz is a 54 year old male with a history of anxiety who presents with anxiety and panic attacks following recent work-related stress.  Anxiety and panic attacks - Acute exacerbation of anxiety and panic attacks over the past ten days, triggered by news of upcoming organizational change at work - Symptoms include heavy sensation on shoulders, neck tension, morning-predominant anxiety, fluctuating severity during the week, and exhaustion - Particularly severe panic attack on Sunday, requiring Xanax  and resulting in marked fatigue and an unusual two-hour nap - Mental exhaustion and concern about ability to function at work - Seeking support for coping with current stress and anxiety  Psychotropic medication use - Sertraline  100 mg daily, previously reduced from a higher dose - Xanax  used four to five times in the past week after a long period without use  Psychosocial stressors - Work-related stress due to upcoming organizational change - Concern about work [employer] systems - Discussed symptoms with therapist and wife - Teleconference scheduled with psychiatric nurse practitioner on Wednesday    Wt Readings from Last 3 Encounters:  12/15/24 175 lb 6 oz (79.5 kg)  09/29/24 191 lb 3.2 oz (86.7 kg)  09/10/24 194 lb 9.6 oz (88.3 kg)    Review of Systems See above   Past Medical History:  Diagnosis Date   Allergy    Anxiety    COVID-19 virus infection 08/2020   Essential hypertension    Overweight (BMI 25.0-29.9)     Past Surgical History:  Procedure Laterality Date   KNEE ARTHROSCOPY W/ MENISCAL REPAIR Left 03/2023   KNEE ARTHROSCOPY W/ MENISCECTOMY Left 07/14/2021     Current Outpatient Medications  Medication Instructions   ALPRAZolam  (XANAX ) 0.25 mg, Oral, Daily PRN   amLODipine  (NORVASC ) 5 mg, Oral, Daily   levocetirizine (XYZAL ) 5 mg, Oral, Every evening   montelukast  (SINGULAIR ) 10 mg, Oral, Daily at bedtime   sertraline  (ZOLOFT ) 100 mg, Oral, Daily       Objective:   Physical Exam BP 138/80   Pulse 79   Temp 97.6 F (36.4 C) (Oral)   Resp 16   Ht 5' 9 (1.753 m)   Wt 175 lb 6 oz (79.5 kg)   SpO2 97%   BMI 25.90 kg/m  General:   Well developed, NAD, BMI noted. HEENT:  Normocephalic . Face symmetric, atraumatic Skin: Not pale. Not jaundice Neurologic:  alert & oriented X3.  Speech normal, gait appropriate for age and unassisted Psych--  Cognition and judgment appear intact.  Cooperative with normal attention span and concentration.  Behavior appropriate. Slightly anxious but not depressed appearing.      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 No suicidal or homicidal ideas. Fortunately this changes in his job his future looks pretty good. Listening therapy provided. PHQ-9 score 11, GAD scored 11.  Plan: - Increased sertraline  to 150 mg daily. - Continue alprazolam  as needed, up to twice daily. - Follow up with  psychiatric nurse practitioner on Wednesday.  Further medication management per psychiatry but he knows he can call me anytime. Weight loss: Weight loss noted, he has been using a commercial GLP-1 for few months, plans to stop as he already achieve desired weight Influenza immunization today. RTC already scheduled for June 2026 CPX.        6// PLAN: Here for CPX Tdap: 2017 Vaccines I recommend-  shingrix #2, Covid booster and  flu shot   CCS: Colonoscopy 08/15/2023, next per GI Prostate ca screening : Distant family members have prostate cancer, no symptoms, checking labs. Labs: CMP FLP CBC A1c PSA He exercises regularly, encouraged heart healthy diet with portion control and decrease carbohydrate  Other issues Anxiety: Well-controlled HTN: On amlodipine ,  ambulatory BPs seldom checked by they are essentially normal. Planta fasciitis: sxs c/w this dx, explained patient what this condition is, rec  stretching, 3 times a day initially, then daily.  Consider a shoe insert for high arch.  Call if not better Umbilical hernia: Small and asymptomatic, we agreed on observation for now. RTC 1 year      "

## 2024-12-16 NOTE — Assessment & Plan Note (Signed)
 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 No suicidal or homicidal ideas. Fortunately despite the changes, he has a good future in the company. Listening therapy provided. PHQ-9 score 11, GAD scored 11. Plan: - Increased sertraline  to 150 mg daily. - Continue alprazolam  as needed, up to twice daily. - Follow up with psychiatric nurse practitioner , further medication management per psychiatry. - He knows he can call me anytime. Weight loss: Weight loss noted, he has been using a commercial GLP-1 for few months, plans to stop as he already achieve desired weight Influenza immunization today. RTC already scheduled for June 2026 CPX.

## 2024-12-23 ENCOUNTER — Telehealth: Admitting: Adult Health

## 2024-12-23 ENCOUNTER — Encounter: Payer: Self-pay | Admitting: Adult Health

## 2024-12-23 DIAGNOSIS — F419 Anxiety disorder, unspecified: Secondary | ICD-10-CM

## 2024-12-23 DIAGNOSIS — F410 Panic disorder [episodic paroxysmal anxiety] without agoraphobia: Secondary | ICD-10-CM

## 2024-12-23 DIAGNOSIS — F411 Generalized anxiety disorder: Secondary | ICD-10-CM

## 2024-12-23 MED ORDER — ALPRAZOLAM 0.25 MG PO TABS: 0.2500 mg | ORAL_TABLET | Freq: Every day | ORAL | 2 refills | Status: AC | PRN

## 2024-12-23 MED ORDER — SERTRALINE HCL 100 MG PO TABS: 100.0000 mg | ORAL_TABLET | Freq: Every day | ORAL | 3 refills | Status: AC

## 2024-12-23 NOTE — Progress Notes (Signed)
 Jorge Schwartz [id number] 01-Mar-1971 54 y.o.  Virtual Visit via Video Note  I connected with pt @ on 12/23/24 at  2:00 PM EST by a video enabled telemedicine application and verified that I am speaking with the correct person using two identifiers.   I discussed the limitations of evaluation and management by telemedicine and the availability of in person appointments. The patient expressed understanding and agreed to proceed.  I discussed the assessment and treatment plan with the patient. The patient was provided an opportunity to ask questions and all were answered. The patient agreed with the plan and demonstrated an understanding of the instructions.   The patient was advised to call back or seek an in-person evaluation if the symptoms worsen or if the condition fails to improve as anticipated.  I provided 25 minutes of non-face-to-face time during this encounter.  The patient was located at home.  The provider was located at [facility].   Angeline LOISE Sayers, NP   Subjective:   Patient ID:  Jorge Schwartz is a 54 y.o. (DOB 1971-10-08) male.  Chief Complaint: No chief complaint on file.   HPI Jorge Schwartz presents for follow-up of anxiety, fear of flying, and panic attacks.  Describes mood today as ok. Pleasant. Mood symptoms - denies depression and irritability. 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 Stating I feel like I'm doing ok. Reports stable interest and motivation. Denies worry, rumination and over thinking. Reports mood is stable. Stating I feel like I'm doing ok. Taking medications as prescribed.  Energy levels stable. Active, has a regular exercise routine. Enjoys some usual interests and activities. Married. Lives with wife and 36-102 year old sons. No family local. Appetite adequate. Weight fluctuates - 200 pounds.  Sleeps well  most nights. Averages 7 hours. Focus and concentration stable. Completing tasks. Managing aspects of household. Works full time. Denies SI or HI.  Denies AH or VH. Denies self harm. Denies substance use.  Previous medication trials: Clonazepam   Review of Systems:  Review of Systems  Musculoskeletal:  Negative for gait problem.  Neurological:  Negative for tremors.  Psychiatric/Behavioral:         Please refer to HPI    Medications: I have reviewed the patient's current medications.  Current Outpatient Medications  Medication Sig Dispense Refill   ALPRAZolam  (XANAX ) 0.25 MG tablet Take 1 tablet (0.25 mg total) by mouth daily as needed for anxiety. 30 tablet 0   amLODipine  (NORVASC ) 5 MG tablet Take 1 tablet (5 mg total) by mouth daily. 90 tablet 2   levocetirizine (XYZAL ) 5 MG tablet Take 1 tablet (5 mg total) by mouth every evening. 30 tablet 3   montelukast  (SINGULAIR ) 10 MG tablet Take 1 tablet (10 mg total) by mouth at bedtime. 30 tablet 3   sertraline  (ZOLOFT ) 100 MG tablet Take 1.5 tablets (150 mg total) by mouth daily.     No current facility-administered medications for this visit.    Medication Side Effects: None  Allergies: Allergies[1]  Past Medical History:  Diagnosis Date   Allergy    Anxiety    COVID-19 virus infection 08/2020   Essential hypertension    Overweight (BMI 25.0-29.9)     Family History  Problem Relation Age of Onset   Prostate cancer Other        uncles, cousins   Sudden death Neg Hx  Colon cancer Neg Hx    CAD Neg Hx    Diabetes Neg Hx    Esophageal cancer Neg Hx    Rectal cancer Neg Hx    Stomach cancer Neg Hx     Social History   Socioeconomic History   Marital status: Married    Spouse name: Not on file   Number of children: 2   Years of education: Not on file   Highest education level: Master's degree (e.g., MA, MS, MEng, MEd, MSW, MBA)  Occupational History   Occupation: Scientist, Research (physical Sciences)  Tobacco Use   Smoking  status: Never   Smokeless tobacco: Never  Vaping Use   Vaping status: Never Used  Substance and Sexual Activity   Alcohol use: Yes    Comment: socially    Drug use: Never   Sexual activity: Yes  Other Topics Concern   Not on file  Social History Narrative    Married to Mildred with 2 children. Has a masters degree.  Supply chain mnger n   2 boys (twins) born 2008   Born in [place]   Social Drivers of Health   Tobacco Use: Low Risk (12/15/2024)   Patient History    Smoking Tobacco Use: Never    Smokeless Tobacco Use: Never    Passive Exposure: Not on file  Financial Resource Strain: Low Risk (09/10/2024)   Overall Financial Resource Strain (CARDIA)    Difficulty of Paying Living Expenses: Not very hard  Food Insecurity: No Food Insecurity (09/10/2024)   Epic    Worried About Programme Researcher, Broadcasting/film/video in the Last Year: Never true    Ran Out of Food in the Last Year: Never true  Transportation Needs: No Transportation Needs (09/10/2024)   Epic    Lack of Transportation (Medical): No    Lack of Transportation (Non-Medical): No  Physical Activity: Sufficiently Active (09/10/2024)   Exercise Vital Sign    Days of Exercise per Week: 4 days    Minutes of Exercise per Session: 50 min  Stress: No Stress Concern Present (09/10/2024)   [name] of Occupational Health - Occupational Stress Questionnaire    Feeling of Stress: Only a little  Social Connections: Moderately Integrated (09/10/2024)   Social Connection and Isolation Panel    Frequency of Communication with Friends and Family: Twice a week    Frequency of Social Gatherings with Friends and Family: Once a week    Attends Religious Services: More than 4 times per year    Active Member of Clubs or Organizations: No    Attends Engineer, Structural: Not on file    Marital Status: Married  Catering Manager Violence: Not on file  Depression (PHQ2-9): Medium Risk (12/15/2024)   Depression (PHQ2-9)     PHQ-2 Score: 6  Alcohol Screen: Low Risk (09/10/2024)   Alcohol Screen    Last Alcohol Screening Score (AUDIT): 3  Housing: Low Risk (09/10/2024)   Epic    Unable to Pay for Housing in the Last Year: No    Number of Times Moved in the Last Year: 0    Homeless in the Last Year: No  Utilities: Not on file  Health Literacy: Not on file    Past Medical History, Surgical history, Social history, and Family history were reviewed and updated as appropriate.   Please see review of systems for further details on the patient's review from today.   Objective:   Physical Exam:  There were no vitals taken for  this visit.  Physical Exam Constitutional:      General: He is not in acute distress. Musculoskeletal:        General: No deformity.  Neurological:     Mental Status: He is alert and oriented to person, place, and time.     Coordination: Coordination normal.  Psychiatric:        Attention and Perception: Attention and perception normal. He does not perceive auditory or visual hallucinations.        Mood and Affect: Mood normal. Mood is not anxious or depressed. Affect is not labile, blunt, angry or inappropriate.        Speech: Speech normal.        Behavior: Behavior normal.        Thought Content: Thought content normal. Thought content is not paranoid or delusional. Thought content does not include homicidal or suicidal ideation. Thought content does not include homicidal or suicidal plan.        Cognition and Memory: Cognition and memory normal.        Judgment: Judgment normal.     Comments: Insight intact     Lab Review:     Component Value Date/Time   NA 142 05/15/2024 0834   K 4.8 05/15/2024 0834   CL 108 05/15/2024 0834   CO2 29 05/15/2024 0834   GLUCOSE 103 (H) 05/15/2024 0834   BUN 14 05/15/2024 0834   CREATININE 0.91 05/15/2024 0834   CREATININE 1.04 07/15/2020 1615   CALCIUM 9.0 05/15/2024 0834   PROT 6.3 05/15/2024 0834   ALBUMIN 4.1 05/15/2024 0834   AST  15 05/15/2024 0834   ALT 14 05/15/2024 0834   ALKPHOS 54 05/15/2024 0834   BILITOT 0.6 05/15/2024 0834       Component Value Date/Time   WBC 6.2 05/15/2024 0834   RBC 4.47 05/15/2024 0834   HGB 12.9 (L) 05/15/2024 0834   HCT 39.0 05/15/2024 0834   PLT 386.0 05/15/2024 0834   MCV 87.3 05/15/2024 0834   MCH 28.9 07/15/2020 1615   MCHC 33.2 05/15/2024 0834   RDW 14.1 05/15/2024 0834   LYMPHSABS 1.6 05/15/2024 0834   MONOABS 0.4 05/15/2024 0834   EOSABS 0.3 05/15/2024 0834   BASOSABS 0.1 05/15/2024 0834    No results found for: POCLITH, LITHIUM   No results found for: PHENYTOIN, PHENOBARB, VALPROATE, CBMZ   .res Assessment: Plan:    Plan:  PDMP reviewed  Decrease Zoloft  150mg  to 100mg  after 30 days - Dr. Amon prescribing Xanax  0.25mg  daily prn for flight anxiety - uses a few times a year - none needed today.  RTC 1 year  25 minutes spent dedicated to the care of this patient on the date of this encounter to include pre-visit review of records, ordering of medication, post visit documentation, and face-to-face time with the patient discussing anxiety and panic attacks. Discussed continuing current medication regimen.  Patient advised to contact office with any questions, adverse effects, or acute worsening in signs and symptoms.  Discussed potential benefits, risk, and side effects of benzodiazepines to include potential risk of tolerance and dependence, as well as possible drowsiness.  Advised patient not to drive if experiencing drowsiness and to take lowest possible effective dose to minimize risk of dependence and tolerance. There are no diagnoses linked to this encounter.   Please see After Visit Summary for patient specific instructions.  Future Appointments  Date Time Provider Department Center  12/23/2024  2:00 PM Deeandra Jerry Nattalie, NP [facility] None  05/21/2025  8:20 AM Amon Aloysius BRAVO, MD [id number]    No orders of the defined types were  placed in this encounter.     -------------------------------     [1] No Known Allergies

## 2025-05-21 ENCOUNTER — Encounter: Admitting: Internal Medicine
